# Patient Record
Sex: Male | Born: 1960 | Race: White | Hispanic: No | Smoking: Current every day smoker
Health system: Southern US, Community
[De-identification: ages and names within clinical notes are randomized; demographics above are authoritative.]

## PROBLEM LIST (undated history)

## (undated) DIAGNOSIS — I10 Essential (primary) hypertension: Secondary | ICD-10-CM

## (undated) DIAGNOSIS — K56609 Unspecified intestinal obstruction, unspecified as to partial versus complete obstruction: Secondary | ICD-10-CM

## (undated) DIAGNOSIS — J449 Chronic obstructive pulmonary disease, unspecified: Secondary | ICD-10-CM

---

## 2000-05-11 ENCOUNTER — Encounter: Payer: Self-pay | Admitting: Emergency Medicine

## 2000-05-11 ENCOUNTER — Emergency Department (HOSPITAL_COMMUNITY): Admission: EM | Admit: 2000-05-11 | Discharge: 2000-05-11 | Payer: Self-pay

## 2002-05-15 ENCOUNTER — Emergency Department (HOSPITAL_COMMUNITY): Admission: EM | Admit: 2002-05-15 | Discharge: 2002-05-16 | Payer: Self-pay | Admitting: Emergency Medicine

## 2002-05-15 ENCOUNTER — Encounter: Payer: Self-pay | Admitting: Emergency Medicine

## 2005-10-14 ENCOUNTER — Emergency Department (HOSPITAL_COMMUNITY): Admission: EM | Admit: 2005-10-14 | Discharge: 2005-10-14 | Payer: Self-pay | Admitting: Emergency Medicine

## 2005-11-10 ENCOUNTER — Emergency Department (HOSPITAL_COMMUNITY): Admission: EM | Admit: 2005-11-10 | Discharge: 2005-11-10 | Payer: Self-pay | Admitting: Emergency Medicine

## 2005-12-05 HISTORY — PX: ORIF MANDIBULAR FRACTURE: SHX2127

## 2005-12-15 ENCOUNTER — Inpatient Hospital Stay (HOSPITAL_COMMUNITY): Admission: EM | Admit: 2005-12-15 | Discharge: 2005-12-16 | Payer: Self-pay | Admitting: Emergency Medicine

## 2005-12-17 ENCOUNTER — Emergency Department (HOSPITAL_COMMUNITY): Admission: EM | Admit: 2005-12-17 | Discharge: 2005-12-17 | Payer: Self-pay | Admitting: Emergency Medicine

## 2008-09-14 ENCOUNTER — Emergency Department (HOSPITAL_COMMUNITY): Admission: EM | Admit: 2008-09-14 | Discharge: 2008-09-14 | Payer: Self-pay | Admitting: Emergency Medicine

## 2008-09-23 ENCOUNTER — Emergency Department (HOSPITAL_COMMUNITY): Admission: EM | Admit: 2008-09-23 | Discharge: 2008-09-23 | Payer: Self-pay | Admitting: Emergency Medicine

## 2008-10-01 ENCOUNTER — Emergency Department (HOSPITAL_COMMUNITY): Admission: EM | Admit: 2008-10-01 | Discharge: 2008-10-01 | Payer: Self-pay | Admitting: Emergency Medicine

## 2008-10-06 ENCOUNTER — Emergency Department (HOSPITAL_COMMUNITY): Admission: EM | Admit: 2008-10-06 | Discharge: 2008-10-06 | Payer: Self-pay | Admitting: Emergency Medicine

## 2008-11-19 ENCOUNTER — Emergency Department (HOSPITAL_COMMUNITY): Admission: EM | Admit: 2008-11-19 | Discharge: 2008-11-19 | Payer: Self-pay | Admitting: Emergency Medicine

## 2009-01-14 ENCOUNTER — Emergency Department (HOSPITAL_COMMUNITY): Admission: EM | Admit: 2009-01-14 | Discharge: 2009-01-14 | Payer: Self-pay | Admitting: Emergency Medicine

## 2009-01-29 ENCOUNTER — Emergency Department (HOSPITAL_COMMUNITY): Admission: EM | Admit: 2009-01-29 | Discharge: 2009-01-29 | Payer: Self-pay | Admitting: Emergency Medicine
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
## 2014-02-02 DIAGNOSIS — K56609 Unspecified intestinal obstruction, unspecified as to partial versus complete obstruction: Secondary | ICD-10-CM

## 2014-02-02 HISTORY — DX: Unspecified intestinal obstruction, unspecified as to partial versus complete obstruction: K56.609

## 2014-02-12 ENCOUNTER — Emergency Department (HOSPITAL_COMMUNITY)
Admission: EM | Admit: 2014-02-12 | Discharge: 2014-02-13 | Disposition: A | Discharge status: Home / Self Care | Payer: Self-pay | Attending: Emergency Medicine | Admitting: Emergency Medicine

## 2014-02-12 DIAGNOSIS — J441 Chronic obstructive pulmonary disease with (acute) exacerbation: Secondary | ICD-10-CM | POA: Insufficient documentation

## 2014-02-12 DIAGNOSIS — I10 Essential (primary) hypertension: Secondary | ICD-10-CM | POA: Insufficient documentation

## 2014-02-12 DIAGNOSIS — F17200 Nicotine dependence, unspecified, uncomplicated: Secondary | ICD-10-CM | POA: Insufficient documentation

## 2014-02-12 HISTORY — DX: Essential (primary) hypertension: I10

## 2014-02-13 ENCOUNTER — Emergency Department (HOSPITAL_COMMUNITY): Payer: Self-pay

## 2014-02-13 ENCOUNTER — Encounter (HOSPITAL_COMMUNITY): Payer: Self-pay | Admitting: Emergency Medicine

## 2014-02-13 MED ORDER — PREDNISONE 20 MG PO TABS: 60.0000 mg | ORAL_TABLET | Freq: Every day | ORAL | Status: DC

## 2014-02-13 MED ORDER — ALBUTEROL SULFATE HFA 108 (90 BASE) MCG/ACT IN AERS
2.0000 | INHALATION_SPRAY | RESPIRATORY_TRACT | Status: DC | PRN
  Filled 2014-02-13: qty 6.7

## 2014-02-13 MED ORDER — PREDNISONE 50 MG PO TABS
60.0000 mg | ORAL_TABLET | Freq: Once | ORAL | Status: AC
  Administered 2014-02-13: 60 mg via ORAL
  Filled 2014-02-13 (×2): qty 1

## 2014-02-13 MED ORDER — ALBUTEROL SULFATE (2.5 MG/3ML) 0.083% IN NEBU
5.0000 mg | INHALATION_SOLUTION | Freq: Once | RESPIRATORY_TRACT | Status: AC
  Administered 2014-02-13: 5 mg via RESPIRATORY_TRACT
  Filled 2014-02-13: qty 6

## 2014-02-13 NOTE — ED Notes (Signed)
Patient states he is homeless and was burning wood outside.  Patient states he is having a hard time breathing.

## 2014-02-13 NOTE — ED Provider Notes (Signed)
CSN: [id number]     Arrival date & time 02/12/14  2354 History  This chart was scribed for Sunnie NielsenBrian Shabnam Ladd, MD by Ardelia Memsylan Malpass, ED Scribe. This patient was seen in room APA10/APA10 and the patient's care was started at 12:02 AM.   Chief Complaint  Patient presents with  . Shortness of Breath    Patient is a 53 y.o. male presenting with shortness of breath. The history is provided by the patient. No language interpreter was used.  Shortness of Breath Severity:  Moderate Onset quality:  Gradual Duration:  1 day Timing:  Constant Progression:  Unchanged Chronicity:  New Context: smoke exposure and weather changes   Relieved by:  Nothing Worsened by:  Nothing tried Ineffective treatments:  Inhaler Associated symptoms: no chest pain and no fever   Risk factors: alcohol use and tobacco use     HPI Comments: [name] is a 53 y.o. male with a history of HTN who presents to the Emergency Department complaining of SOB onset earlier tonight. He states that he is homeless and he was burning wood earlier tonight which may have contributed. He also states that he believes that weather changes have contributed to his SOB. He states that he has been using an inhaler without relief. He denies chest pain, fever, chills or any other symptoms. He states that he is a current every day smoker.   Past Medical History  Diagnosis Date  . Hypertension    History reviewed. No pertinent past surgical history. No family history on file. History  Substance Use Topics  . Smoking status: Current Every Day Smoker  . Smokeless tobacco: Not on file  . Alcohol Use: Yes    Review of Systems  Constitutional: Negative for fever and chills.  Respiratory: Positive for shortness of breath.   Cardiovascular: Negative for chest pain.  All other systems reviewed and are negative.   Allergies  Review of patient's allergies indicates no known allergies.  Home Medications  No current outpatient prescriptions  on file.  Triage Vitals: BP 151/93  Pulse 73  Temp(Src) 97.5 F (36.4 C) (Oral)  Resp 20  Ht 5\' 7"  (1.702 m)  Wt 120 lb (54.432 kg)  BMI 18.79 kg/m2  SpO2 100%  Physical Exam  Nursing note and vitals reviewed. Constitutional: He is oriented to person, place, and time. He appears well-developed and well-nourished. No distress.  HENT:  Head: Normocephalic and atraumatic.  Eyes: EOM are normal.  Neck: Neck supple. No tracheal deviation present.  Cardiovascular: Normal rate.   Pulmonary/Chest: Effort normal. No respiratory distress.  Shallow bilateral breath sounds. No accessory muscle use.  Musculoskeletal: Normal range of motion.  Neurological: He is alert and oriented to person, place, and time.  Skin: Skin is warm and dry.  Psychiatric: He has a normal mood and affect. His behavior is normal.    ED Course  Procedures (including critical care time)  DIAGNOSTIC STUDIES: Oxygen Saturation is 100% on RA, normal by my interpretation.    COORDINATION OF CARE: 12:06 AM- Discussed plan to order a CXR, an Albuterol breathing treatment and Prednisone. Pt advised of plan for treatment and pt agrees.  Medications  predniSONE (DELTASONE) tablet 60 mg (not administered)  albuterol (PROVENTIL) (2.5 MG/3ML) 0.083% nebulizer solution 5 mg (5 mg Nebulization Given 02/13/14 0028)   Labs Review Labs Reviewed - No data to display Imaging Review Dg Chest 2 View  02/13/2014   CLINICAL DATA Shortness of breath.  EXAM CHEST  2 VIEW  COMPARISON Prior radiograph from 12/15/2005  FINDINGS The cardiac and mediastinal silhouettes are stable in size and contour, and remain within normal limits.  The lungs are hyperinflated with attenuation of the pulmonary markings, greatest within the left lung base, similar to prior. Findings are compatible with COPD. No airspace consolidation, pleural effusion, or pulmonary edema is identified. There is no pneumothorax. Irregular biapical pleural thickening noted.   No acute osseous abnormality identified.  IMPRESSION COPD.  No acute cardiopulmonary abnormality.  SIGNATURE  Electronically Signed   By: Rise Mu M.D.   On: 02/13/2014 01:51     EKG Interpretation   Date/Time:  Thursday February 13 2014 00:09:50 EDT Ventricular Rate:  72 PR Interval:  130 QRS Duration: 100 QT Interval:  384 QTC Calculation: 420 R Axis:   85 Text Interpretation:  Normal sinus rhythm Incomplete right bundle branch  block Borderline ECG When compared with ECG of 15-Dec-2005 23:35, No  significant change was found Confirmed by Kroy Sprung  MD, Lailah Marcelli ([id number]) on  02/13/2014 12:28:19 AM      Albuterol breathing treatment and prednisone provided.  On recheck is feeling significantly better. Patient ambulates to radiology without dyspnea or distress. Chest x-ray reviewed as above. Lungs clear to auscultation.  Plan discharge home with albuterol inhaler. Patient encouraged to fill prescription for prednisone for the next 5 days. Return precautions and outpatient referral provided. Symptoms improved and stable for discharge.  MDM   Diagnosis: COPD exacerbation  Serial evaluations// Symptoms improved with albuterol and prednisone  Chest x-ray obtained and reviewed as above consistent with COPD, no infiltrate  Vital signs and nurses notes reviewed and considered  I personally performed the services described in this documentation, which was scribed in my presence. The recorded information has been reviewed and is accurate.    Sunnie Nielsen, MD 02/13/14 [phone]

## 2014-02-13 NOTE — Discharge Instructions (Signed)
Chronic Obstructive Pulmonary Disease Chronic obstructive pulmonary disease (COPD) is a common lung problem. In COPD, the flow of air from the lungs is limited. The way your lungs work will probably never return to normal, but there are things you can do to improve you lungs and make yourself feel better. HOME CARE  Take all medicines as told by your doctor.  Only take over-the-counter or prescription medicines as told by your doctor.  Avoid medicines or cough syrups that dry up your airway (such as antihistamines) and do not allow you to get rid of thick spit. You do not need to avoid them if told differently by your doctor.  If you smoke, stop. Smoking makes the problem worse.  Avoid being around things that make your breathing worse (like smoke, chemicals, and fumes).  Use oxygen therapy and therapy to help improve your lungs (pulmonary rehabilitation) if told by your doctor. If you need home oxygen therapy, ask your doctor if you should buy a tool to measure your oxygen level (oximeter).  Avoid people who have a sickness you can catch (contagious).  Avoid going outside when it is very hot, cold, or humid.  Eat healthy foods. Eat smaller meals more often. Rest before meals.  Stay active, but remember to also rest.  Make sure to get all the shots (vaccines) your doctor recommends. Ask your doctor if you need a pneumonia shot.  Learn and use tips on how to relax.  Learn and use tips on how to control your breathing as told by your doctor. Try:  Breathing in (inhaling) through your nose for 1 second. Then, pucker your lips and breath out (exhale) through your lips for 2 seconds.  Putting one hand on your belly (abdomen). Breathe in slowly through your nose for 1 second. Your hand on your belly should move out. Pucker your lips and breathe out slowly through your lips. Your hand on your belly should move in as you breathe out.  Learn and use controlled coughing to clear thick spit  from your lungs. 1. Lean your head a little forward. 2. Breathe in deeply. 3. Try to hold your breath for 3 seconds. 4. Keep your mouth slightly open while coughing 2 times. 5. Spit any thick spit out into a tissue. 6. Rest and do the steps again 1 or 2 times as needed. GET HELP IF:  You cough up more thick spit than usual.  There is a change in the color or thickness of the spit.  It is harder to breathe than usual.  Your breathing is faster than usual. GET HELP RIGHT AWAY IF:   You have shortness of breath while resting.  You have shortness of breath that stops you from:  Being able to talk.  Doing normal activities.  You chest hurts for longer than 5 minutes.  Your skin color is more blue than usual.  Your pulse oximeter shows that you have low oxygen for longer than 5 minutes. MAKE SURE YOU:   Understand these instructions.  Will watch your condition.  Will get help right away if you are not doing well or get worse. Document Released: 05/09/2008 Document Revised: 09/11/2013 Document Reviewed: 07/18/2013 [employer] Patient Information 2014 [employer].  
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
## 2014-02-19 ENCOUNTER — Emergency Department (HOSPITAL_COMMUNITY): Payer: Self-pay

## 2014-02-19 ENCOUNTER — Inpatient Hospital Stay (HOSPITAL_COMMUNITY)
Admission: EM | Admit: 2014-02-19 | Discharge: 2014-02-24 | DRG: 389 | Disposition: A | Discharge status: Home / Self Care | Payer: Self-pay | Attending: General Surgery | Admitting: General Surgery

## 2014-02-19 ENCOUNTER — Encounter (HOSPITAL_COMMUNITY): Payer: Self-pay | Admitting: Emergency Medicine

## 2014-02-19 DIAGNOSIS — Z5900 Homelessness unspecified: Secondary | ICD-10-CM

## 2014-02-19 DIAGNOSIS — I10 Essential (primary) hypertension: Secondary | ICD-10-CM | POA: Diagnosis present

## 2014-02-19 DIAGNOSIS — E440 Moderate protein-calorie malnutrition: Secondary | ICD-10-CM | POA: Insufficient documentation

## 2014-02-19 DIAGNOSIS — F17200 Nicotine dependence, unspecified, uncomplicated: Secondary | ICD-10-CM | POA: Diagnosis present

## 2014-02-19 DIAGNOSIS — IMO0002 Reserved for concepts with insufficient information to code with codable children: Secondary | ICD-10-CM

## 2014-02-19 DIAGNOSIS — K56609 Unspecified intestinal obstruction, unspecified as to partial versus complete obstruction: Principal | ICD-10-CM | POA: Diagnosis present

## 2014-02-19 LAB — COMPREHENSIVE METABOLIC PANEL
ALK PHOS: 76 U/L (ref 39–117)
ALT: 20 U/L (ref 0–53)
AST: 24 U/L (ref 0–37)
Albumin: 3.7 g/dL (ref 3.5–5.2)
BUN: 18 mg/dL (ref 6–23)
CO2: 28 mEq/L (ref 19–32)
Calcium: 8.7 mg/dL (ref 8.4–10.5)
Chloride: 100 mEq/L (ref 96–112)
Creatinine, Ser: 0.94 mg/dL (ref 0.50–1.35)
GFR calc Af Amer: >90 mL/min (ref >90)
GFR calc non Af Amer: >90 mL/min (ref >90)
Glucose, Bld: 147 mg/dL — ABNORMAL HIGH (ref 70–99)
POTASSIUM: 4.5 meq/L (ref 3.7–5.3)
Sodium: 139 mEq/L (ref 137–147)
Total Bilirubin: 0.4 mg/dL (ref 0.3–1.2)
Total Protein: 6.5 g/dL (ref 6.0–8.3)

## 2014-02-19 LAB — CBC WITH DIFFERENTIAL/PLATELET
BASOS PCT: 0 % (ref 0–1)
Basophils Absolute: 0 10*3/uL (ref 0.0–0.1)
Eosinophils Absolute: 0 10*3/uL (ref 0.0–0.7)
Eosinophils Relative: 0 % (ref 0–5)
HCT: 42.2 % (ref 39.0–52.0)
Hemoglobin: 14.5 g/dL (ref 13.0–17.0)
Lymphocytes Relative: 5 % — ABNORMAL LOW (ref 12–46)
Lymphs Abs: 0.7 10*3/uL (ref 0.7–4.0)
MCH: 33.2 pg (ref 26.0–34.0)
MCHC: 34.4 g/dL (ref 30.0–36.0)
MCV: 96.6 fL (ref 78.0–100.0)
Monocytes Absolute: 0.7 10*3/uL (ref 0.1–1.0)
Monocytes Relative: 5 % (ref 3–12)
Neutro Abs: 12.8 10*3/uL — ABNORMAL HIGH (ref 1.7–7.7)
Neutrophils Relative %: 90 % — ABNORMAL HIGH (ref 43–77)
Platelets: 256 10*3/uL (ref 150–400)
RBC: 4.37 MIL/uL (ref 4.22–5.81)
RDW: 14 % (ref 11.5–15.5)
WBC: 14.2 10*3/uL — ABNORMAL HIGH (ref 4.0–10.5)

## 2014-02-19 LAB — LIPASE, BLOOD: Lipase: 9 U/L — ABNORMAL LOW (ref 11–59)

## 2014-02-19 MED ORDER — CIPROFLOXACIN IN D5W 400 MG/200ML IV SOLN
400.0000 mg | Freq: Two times a day (BID) | INTRAVENOUS | Status: DC
  Administered 2014-02-20 – 2014-02-24 (×9): 400 mg via INTRAVENOUS
  Filled 2014-02-19 (×9): qty 200

## 2014-02-19 MED ORDER — LORAZEPAM 1 MG PO TABS: 1.0000 mg | ORAL_TABLET | Freq: Four times a day (QID) | ORAL | Status: AC | PRN

## 2014-02-19 MED ORDER — THIAMINE HCL 100 MG/ML IJ SOLN
100.0000 mg | Freq: Every day | INTRAMUSCULAR | Status: DC
  Administered 2014-02-19 – 2014-02-21 (×3): 100 mg via INTRAVENOUS
  Filled 2014-02-19 (×4): qty 2

## 2014-02-19 MED ORDER — NICOTINE 21 MG/24HR TD PT24
21.0000 mg | MEDICATED_PATCH | Freq: Every day | TRANSDERMAL | Status: DC
  Administered 2014-02-19 – 2014-02-24 (×6): 21 mg via TRANSDERMAL
  Filled 2014-02-19 (×6): qty 1

## 2014-02-19 MED ORDER — FOLIC ACID 1 MG PO TABS
1.0000 mg | ORAL_TABLET | Freq: Every day | ORAL | Status: DC
  Administered 2014-02-21 – 2014-02-24 (×4): 1 mg via ORAL
  Filled 2014-02-19 (×4): qty 1

## 2014-02-19 MED ORDER — LORAZEPAM 2 MG/ML IJ SOLN: 0.0000 mg | Freq: Four times a day (QID) | INTRAMUSCULAR | Status: AC

## 2014-02-19 MED ORDER — LORAZEPAM 2 MG/ML IJ SOLN: 1.0000 mg | Freq: Four times a day (QID) | INTRAMUSCULAR | Status: AC | PRN

## 2014-02-19 MED ORDER — IOHEXOL 300 MG/ML  SOLN
100.0000 mL | Freq: Once | INTRAMUSCULAR | Status: AC | PRN
  Administered 2014-02-19: 100 mL via INTRAVENOUS

## 2014-02-19 MED ORDER — VITAMIN B-1 100 MG PO TABS
100.0000 mg | ORAL_TABLET | Freq: Every day | ORAL | Status: DC
  Administered 2014-02-22 – 2014-02-24 (×3): 100 mg via ORAL
  Filled 2014-02-19 (×3): qty 1

## 2014-02-19 MED ORDER — ADULT MULTIVITAMIN W/MINERALS CH
1.0000 | ORAL_TABLET | Freq: Every day | ORAL | Status: DC
  Administered 2014-02-21 – 2014-02-24 (×4): 1 via ORAL
  Filled 2014-02-19 (×4): qty 1

## 2014-02-19 MED ORDER — SODIUM CHLORIDE 0.9 % IV BOLUS (SEPSIS)
1000.0000 mL | Freq: Once | INTRAVENOUS | Status: AC
  Administered 2014-02-19: 1000 mL via INTRAVENOUS

## 2014-02-19 MED ORDER — IOHEXOL 300 MG/ML  SOLN
50.0000 mL | Freq: Once | INTRAMUSCULAR | Status: AC | PRN
  Administered 2014-02-19: 50 mL via ORAL

## 2014-02-19 MED ORDER — ONDANSETRON HCL 4 MG/2ML IJ SOLN
4.0000 mg | Freq: Once | INTRAMUSCULAR | Status: AC
  Administered 2014-02-19: 4 mg via INTRAVENOUS
  Filled 2014-02-19: qty 2

## 2014-02-19 MED ORDER — MORPHINE SULFATE 4 MG/ML IJ SOLN
4.0000 mg | Freq: Once | INTRAMUSCULAR | Status: AC
  Administered 2014-02-19: 4 mg via INTRAVENOUS
  Filled 2014-02-19: qty 1

## 2014-02-19 MED ORDER — METRONIDAZOLE IN NACL 5-0.79 MG/ML-% IV SOLN
500.0000 mg | Freq: Three times a day (TID) | INTRAVENOUS | Status: DC
  Administered 2014-02-19 – 2014-02-24 (×16): 500 mg via INTRAVENOUS
  Filled 2014-02-19 (×16): qty 100

## 2014-02-19 MED ORDER — ACETAMINOPHEN 325 MG PO TABS: 650.0000 mg | ORAL_TABLET | Freq: Four times a day (QID) | ORAL | Status: DC | PRN

## 2014-02-19 MED ORDER — ACETAMINOPHEN 650 MG RE SUPP: 650.0000 mg | Freq: Four times a day (QID) | RECTAL | Status: DC | PRN

## 2014-02-19 MED ORDER — DEXTROSE IN LACTATED RINGERS 5 % IV SOLN
INTRAVENOUS | Status: DC
  Administered 2014-02-19: 12:00:00 via INTRAVENOUS

## 2014-02-19 MED ORDER — ONDANSETRON HCL 4 MG/2ML IJ SOLN: 4.0000 mg | Freq: Four times a day (QID) | INTRAMUSCULAR | Status: DC | PRN

## 2014-02-19 MED ORDER — ENOXAPARIN SODIUM 40 MG/0.4ML ~~LOC~~ SOLN
40.0000 mg | SUBCUTANEOUS | Status: DC
  Administered 2014-02-19 – 2014-02-24 (×6): 40 mg via SUBCUTANEOUS
  Filled 2014-02-19 (×6): qty 0.4

## 2014-02-19 MED ORDER — CIPROFLOXACIN IN D5W 400 MG/200ML IV SOLN
400.0000 mg | Freq: Two times a day (BID) | INTRAVENOUS | Status: DC
Start: 2014-02-19 — End: 2014-02-19
  Administered 2014-02-19: 400 mg via INTRAVENOUS
  Filled 2014-02-19: qty 200

## 2014-02-19 MED ORDER — HYDROMORPHONE HCL PF 1 MG/ML IJ SOLN
1.0000 mg | INTRAMUSCULAR | Status: DC | PRN
  Administered 2014-02-19 – 2014-02-21 (×10): 1 mg via INTRAVENOUS
  Filled 2014-02-19 (×10): qty 1

## 2014-02-19 MED ORDER — LORAZEPAM 2 MG/ML IJ SOLN
0.0000 mg | Freq: Two times a day (BID) | INTRAMUSCULAR | Status: AC
  Filled 2014-02-19: qty 1

## 2014-02-19 MED ORDER — LIDOCAINE HCL 2 % EX GEL
CUTANEOUS | Status: AC
  Filled 2014-02-19: qty 10

## 2014-02-19 MED ORDER — PANTOPRAZOLE SODIUM 40 MG IV SOLR
40.0000 mg | Freq: Every day | INTRAVENOUS | Status: DC
  Administered 2014-02-19 – 2014-02-20 (×2): 40 mg via INTRAVENOUS
  Filled 2014-02-19 (×2): qty 40

## 2014-02-19 NOTE — Clinical Social Work Psychosocial (Signed)
Clinical Social Work Department BRIEF PSYCHOSOCIAL ASSESSMENT 02/19/2014  Patient:  [name], [name]     Account Number:  [id number]     Admit date:  02/19/2014  Clinical Social Worker:  Edwyna Shell, CLINICAL SOCIAL WORKER  Date/Time:  02/19/2014 11:00 AM  Referred by:  Physician  Date Referred:  02/19/2014 Referred for  Homelessness   Other Referral:   Interview type:  Patient Other interview type:    PSYCHOSOCIAL DATA Living Status:  ALONE Admitted from facility:   Level of care:   Primary support name:  None identified Primary support relationship to patient:   Degree of support available:   Patient has little/no contact w family or friends in area. Has sister in [place] Current Concerns  Post-Acute Placement   Other Concerns:    SOCIAL WORK ASSESSMENT / PLAN CSW met w patient at bedside, patient alert and oriented x4.  Patient says he lives in tent "somewhere in [place]" - he did not want to give location "because I like to keep to myself."  Says he has been in [place] since 10/04/13 this time, but "i have lived here off/on for past 20 years."  Has ex-wife in [place], daughter in [place] (may be in college) and son in [place].  Has little contact w any of them.  Was living in [place] w sister, sister moved back to [place], patient came to [place] w friends, one of whom has since died and the other has "moved on."  Patient last had stable housing/name on lease approx 9 years ago in [place] on [address] near [place].    Patient has supported himself through doing a variety of odd jobs since return to [employer].  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 Has had difficult time finding job here "because of my age."    Has lived in tent throughout winter, says "I am making it" but says that  he would like to find stable housing and stable job at some point.  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  Eats meals at soup kitchen and [employer].    Agreeable to referral to [facility]ational Nurse, agrees that he may need help w arranging medical care.  Has been to ED twice, including one visit for breathing difficulties due to sitting around his wood fire.  CSW attempted to contact [facility] using contact info given, was unable to reach. Will research and refer if possible.  Did give patient Help for Homelessness, patient states he will call .  Needs assistance w job search and housing stability, which this organization may be able to assist w.   Assessment/plan status:  Psychosocial Support/Ongoing Assessment of Needs Other assessment/ plan:   Information/referral to community resources:   [employer]    PATIENT'S/FAMILY'S RESPONSE TO PLAN OF CARE: Patient very appreciative of help and referrals, does not have phone or mailing address, so has not been able to access resources.  CSw encouraged him to use time in hospital to connect w resources if possible        Edwyna Shell, [place] Worker [phone])

## 2014-02-19 NOTE — Progress Notes (Signed)
INITIAL NUTRITION ASSESSMENT  DOCUMENTATION CODES Per approved criteria  -Non-severe (moderate) malnutrition in the context of social or environmental circumstances   Pt meets criteria for moderate MALNUTRITION in the context of social and environmental cicrumstances as evidenced by moderate muscle and fat depletion, <75% estimated energy intake x 3 months, 7.7% wt loss x 3 months.  INTERVENTION: Follow for diet advancement  NUTRITION DIAGNOSIS: Inadequate oral intake related to altered GI function as evidenced by NPO.   Goal: Pt will meet >90% of estimated nutritional needs  Monitor:  Diet advancement, PO intake, labs, weight changes, skin assessments, I/O's  Reason for Assessment: MST=2  53 y.o. male  Admitting Dx: <principal problem not specified>  ASSESSMENT: Pt admitted for vomiting and severe cramping. Per surgeon's notes, pt with enteritis vs.SBO with possible ileus. No surgical interventions at this time. Pt with ng tube for decompression.  [place]. He states fair appetite PTA, reporting he "eats when he can". Home diet consists of fast food and soup kitchen foods.  He reports UBW of 130#, which he has maintained most of his adult life. He reports a 10# (7.7%) wt loss x 3 months, which is clinically significant.  He is agreeable to trying a nutritional supplement once diet is advanced.   Nutrition Focused Physical Exam:  Subcutaneous Fat:  Orbital Region: WDL Upper Arm Region: moderate depletion Thoracic and Lumbar Region: moderate depletion  Muscle:  Temple Region: WDL Clavicle Bone Region: moderate depletion Clavicle and Acromion Bone Region: moderate depletion Scapular Bone Region: moderate depletion Dorsal Hand: moderate depletion Patellar Region: moderate depletion Anterior Thigh Region: moderate depletion Posterior Calf Region: moderate depletion  Edema: none present   Height: Ht Readings from Last 1 Encounters:   02/19/14 5\' 6"  (1.676 m)    Weight: Wt Readings from Last 1 Encounters:  02/19/14 120 lb (54.432 kg)    Ideal Body Weight: 142#  % Ideal Body Weight: 83%  Wt Readings from Last 10 Encounters:  02/19/14 120 lb (54.432 kg)  02/13/14 120 lb (54.432 kg)    Usual Body Weight: 130#  % Usual Body Weight: 92%  BMI:  Body mass index is 19.38 kg/(m^2). Meets criteria for normal weight.   Estimated Nutritional Needs: Kcal: 1400-1600 daily Protein: 54-68 grams daily Fluid: 1.4-1.6 L daily  Skin: No issues noted   Diet Order: NPO  EDUCATION NEEDS: -Education needs addressed   Intake/Output Summary (Last 24 hours) at 02/19/14 1320 Last data filed at 02/19/14 0800  Gross per 24 hour  Intake      0 ml  Output    300 ml  Net   -300 ml    Last BM: PTA   Labs:   Recent Labs Lab 02/19/14 0320  NA 139  K 4.5  CL 100  CO2 28  BUN 18  CREATININE 0.94  CALCIUM 8.7  GLUCOSE 147*    CBG (last 3)  No results found for this basename: GLUCAP,  in the last 72 hours  Scheduled Meds: . ciprofloxacin  400 mg Intravenous Q12H  . enoxaparin (LOVENOX) injection  40 mg Subcutaneous Q24H  . folic acid  1 mg Oral Daily  . lidocaine      . LORazepam  0-4 mg Intravenous Q6H   Followed by  . [START ON 02/21/2014] LORazepam  0-4 mg Intravenous Q12H  . metronidazole  500 mg Intravenous Q8H  . multivitamin with minerals  1 tablet Oral Daily  . nicotine  21 mg  Transdermal Daily  . pantoprazole (PROTONIX) IV  40 mg Intravenous QHS  . thiamine  100 mg Oral Daily   Or  . thiamine  100 mg Intravenous Daily    Continuous Infusions: . dextrose 5% lactated ringers 125 mL/hr at 02/19/14 1137    Past Medical History  Diagnosis Date  . Hypertension     History reviewed. No pertinent past surgical history.  Ramsie Ostrander A. Mayford KnifeWilliams, RD, LDN Pager: [phone]

## 2014-02-19 NOTE — Progress Notes (Signed)
UR chart review completed.  

## 2014-02-19 NOTE — ED Provider Notes (Signed)
CSN: [id number]     Arrival date & time 02/19/14  0155 History   First MD Initiated Contact with Patient 02/19/14 0239     No chief complaint on file.    (Consider location/radiation/quality/duration/timing/severity/associated sxs/prior Treatment) HPI Comments: Patient presents with complaints of vomiting and abd pain that started at 4 AM.  Pain is mostly epigastric.  He is currently homeless and [place] the woods.    Patient is a 53 y.o. male presenting with abdominal pain. The history is provided by the patient.  Abdominal Pain Pain location:  Generalized Pain quality: cramping   Pain radiates to:  Does not radiate Pain severity:  Moderate Onset quality:  Sudden Duration:  12 hours Timing:  Constant Progression:  Worsening Chronicity:  New Context: not alcohol use and not eating   Relieved by:  Nothing Worsened by:  Nothing tried Ineffective treatments:  None tried Associated symptoms: vomiting     Past Medical History  Diagnosis Date  . Hypertension    History reviewed. No pertinent past surgical history. No family history on file. History  Substance Use Topics  . Smoking status: Current Every Day Smoker  . Smokeless tobacco: Not on file  . Alcohol Use: Yes    Review of Systems  Gastrointestinal: Positive for vomiting and abdominal pain.  All other systems reviewed and are negative.      Allergies  Review of patient's allergies indicates no known allergies.  Home Medications   Current Outpatient Rx  Name  Route  Sig  Dispense  Refill  . predniSONE (DELTASONE) 20 MG tablet   Oral   Take 3 tablets (60 mg total) by mouth daily.   15 tablet   0    BP 145/107  Pulse 89  Temp(Src) 97.7 F (36.5 C) (Oral)  Resp 20  Ht 5\' 6"  (1.676 m)  Wt 120 lb (54.432 kg)  BMI 19.38 kg/m2  SpO2 100% Physical Exam  Nursing note and vitals reviewed. Constitutional: He is oriented to person, place, and time. He appears well-developed and well-nourished. No  distress.  HENT:  Head: Normocephalic and atraumatic.  Mouth/Throat: Oropharynx is clear and moist.  Neck: Normal range of motion. Neck supple.  Cardiovascular: Normal rate, regular rhythm and normal heart sounds.   No murmur heard. Pulmonary/Chest: Effort normal and breath sounds normal. No respiratory distress. He has no wheezes.  Abdominal: Soft. Bowel sounds are normal. He exhibits no distension and no mass. There is tenderness. There is no rebound and no guarding.  TTP in the epigastric region.  Musculoskeletal: Normal range of motion. He exhibits no edema.  Neurological: He is alert and oriented to person, place, and time.  Skin: Skin is warm and dry. He is not diaphoretic.    ED Course  Procedures (including critical care time) Labs Review Labs Reviewed  CBC WITH DIFFERENTIAL  COMPREHENSIVE METABOLIC PANEL  LIPASE, BLOOD   Imaging Review No results found.   EKG Interpretation None      MDM   Final diagnoses:  None    Patient presents with vomiting and severe abdominal cramping. Workup reveals a white count of 14,000 and CT scan reveals dilated bowel loops consistent with a small bowel obstruction. His given pain medicine and IV fluids. I spoke with Dr. Lovell SheehanJenkins from general surgery who will evaluate the patient in the ER. He is recommending and ng tube which will be placed.    Geoffery Lyonsouglas Aziza Stuckert, MD 02/19/14 0630

## 2014-02-19 NOTE — ED Notes (Signed)
Pt states he started having mid abd pain Tuesday am, states now with vomiting, no diarrhea.

## 2014-02-19 NOTE — H&P (Signed)
[name] is an 53 y.o. male.   Chief Complaint: Nausea and abdominal pain HPI: Patient is a 53 year old white male who is homeless and lives in a tent in [name]. He presented with a 24-hour history of worsening nausea and vomiting. He states is abdomen started hurting. He last had a bowel movement 2 days ago. Last drink alcohol 5 days ago. His abdominal pain is non-specific in nature. He has never had an episode like this before. He has never had surgery on his abdomen before. He denies any fever or chills.  Past Medical History  Diagnosis Date  . Hypertension     History reviewed. No pertinent past surgical history.  No family history on file. Social History:  reports that he has been smoking.  He does not have any smokeless tobacco history on file. He reports that he drinks alcohol. He reports that he does not use illicit drugs.  Allergies: No Known Allergies   (Not in a hospital admission)  Results for orders placed during the hospital encounter of 02/19/14 (from the past 48 hour(s))  CBC WITH DIFFERENTIAL     Status: Abnormal   Collection Time    02/19/14  3:20 AM      Result Value Ref Range   WBC 14.2 (*) 4.0 - 10.5 K/uL   RBC 4.37  4.22 - 5.81 MIL/uL   Hemoglobin 14.5  13.0 - 17.0 g/dL   HCT 42.2  39.0 - 52.0 %   MCV 96.6  78.0 - 100.0 fL   MCH 33.2  26.0 - 34.0 pg   MCHC 34.4  30.0 - 36.0 g/dL   RDW 14.0  11.5 - 15.5 %   Platelets 256  150 - 400 K/uL   Neutrophils Relative % 90 (*) 43 - 77 %   Neutro Abs 12.8 (*) 1.7 - 7.7 K/uL   Lymphocytes Relative 5 (*) 12 - 46 %   Lymphs Abs 0.7  0.7 - 4.0 K/uL   Monocytes Relative 5  3 - 12 %   Monocytes Absolute 0.7  0.1 - 1.0 K/uL   Eosinophils Relative 0  0 - 5 %   Eosinophils Absolute 0.0  0.0 - 0.7 K/uL   Basophils Relative 0  0 - 1 %   Basophils Absolute 0.0  0.0 - 0.1 K/uL  COMPREHENSIVE METABOLIC PANEL     Status: Abnormal   Collection Time    02/19/14  3:20 AM      Result Value Ref Range   Sodium 139   137 - 147 mEq/L   Potassium 4.5  3.7 - 5.3 mEq/L   Chloride 100  96 - 112 mEq/L   CO2 28  19 - 32 mEq/L   Glucose, Bld 147 (*) 70 - 99 mg/dL   BUN 18  6 - 23 mg/dL   Creatinine, Ser 0.94  0.50 - 1.35 mg/dL   Calcium 8.7  8.4 - 10.5 mg/dL   Total Protein 6.5  6.0 - 8.3 g/dL   Albumin 3.7  3.5 - 5.2 g/dL   AST 24  0 - 37 U/L   ALT 20  0 - 53 U/L   Alkaline Phosphatase 76  39 - 117 U/L   Total Bilirubin 0.4  0.3 - 1.2 mg/dL   GFR calc non Af Amer >90  >90 mL/min   GFR calc Af Amer >90  >90 mL/min   Comment: (NOTE)     The eGFR has been calculated using the CKD EPI equation.  This calculation has not been validated in all clinical situations.     eGFR's persistently <90 mL/min signify possible Chronic Kidney     Disease.  LIPASE, BLOOD     Status: Abnormal   Collection Time    02/19/14  3:20 AM      Result Value Ref Range   Lipase 9 (*) 11 - 59 U/L   Ct Abdomen Pelvis W Contrast  02/19/2014   CLINICAL DATA:  Abdominal pain and vomiting.  EXAM: CT ABDOMEN AND PELVIS WITH CONTRAST  TECHNIQUE: Multidetector CT imaging of the abdomen and pelvis was performed using the standard protocol following bolus administration of intravenous contrast.  CONTRAST:  50 mL OMNIPAQUE IOHEXOL 300 MG/ML SOLN, 100 mL OMNIPAQUE IOHEXOL 300 MG/ML SOLN  COMPARISON:  None.  FINDINGS: The lung bases are clear. No pleural or pericardial effusion. Heart size is normal.  There is a small volume of abdominal and pelvic ascites. No focal fluid collection is identified. There is mild and diffuse dilatation of small bowel with loops measuring up to 2.9 cm identified. Only the terminal ileum is spared No transition point is seen. There is no bowel wall thickening, pneumatosis, portal venous gas or free intraperitoneal air. The colon is largely decompressed but otherwise unremarkable. The appendix is visualized and appears normal.  A punctate hypo attenuating lesion in the liver on image 23 is likely a cyst. The liver is  otherwise unremarkable. The spleen, adrenal glands, pancreas and kidneys appear normal. There is no lymphadenopathy. No focal bony abnormality is identified.  IMPRESSION: Small volume of abdominal ascites and diffuse small bowel dilatation. The terminal ileum is decompressed and this may represent a transition point for small bowel obstruction. Small bowel ileus is also a consideration. No inflammatory process is identified. There is no evidence of bowel ischemia.   Electronically Signed   By: Inge Rise M.D.   On: 02/19/2014 05:59    Review of Systems  Constitutional: Positive for malaise/fatigue.  HENT: Negative.   Respiratory: Positive for cough.   Cardiovascular: Negative.   Gastrointestinal: Positive for nausea, vomiting and abdominal pain.  Genitourinary: Negative.   Musculoskeletal: Negative.   Skin: Negative.   All other systems reviewed and are negative.    Blood pressure 130/87, pulse 75, temperature 98 F (36.7 C), temperature source Oral, resp. rate 18, height 5' 6"  (1.676 m), weight 54.432 kg (120 lb), SpO2 97.00%. Physical Exam  Constitutional: He is oriented to person, place, and time. He appears well-developed and well-nourished.  HENT:  Head: Normocephalic and atraumatic.  Neck: Normal range of motion. Neck supple.  Cardiovascular: Normal rate, regular rhythm and normal heart sounds.   Respiratory: Effort normal and breath sounds normal.  GI: Soft. He exhibits distension. There is tenderness. There is no rebound.  Minimal bowel sounds appreciated.  Neurological: He is alert and oriented to person, place, and time.  Skin: Skin is warm and dry.     Assessment/Plan Impression: Enteritis versus partial small bowel obstruction. As he has not had abdominal surgery, I would favor an ileus. Plan: Will admit to the hospital for NG tube decompression we'll start ciprofloxacin and Flagyl for enteritis. No need for acute surgical intervention at this time.  [name]  A 02/19/2014, 8:29 AM

## 2014-02-20 DIAGNOSIS — E440 Moderate protein-calorie malnutrition: Secondary | ICD-10-CM | POA: Insufficient documentation

## 2014-02-20 LAB — CBC
HCT: 37.9 % — ABNORMAL LOW (ref 39.0–52.0)
Hemoglobin: 13 g/dL (ref 13.0–17.0)
MCH: 33.2 pg (ref 26.0–34.0)
MCHC: 34.3 g/dL (ref 30.0–36.0)
MCV: 96.7 fL (ref 78.0–100.0)
PLATELETS: 231 10*3/uL (ref 150–400)
RBC: 3.92 MIL/uL — AB (ref 4.22–5.81)
RDW: 14.1 % (ref 11.5–15.5)
WBC: 8.9 10*3/uL (ref 4.0–10.5)

## 2014-02-20 LAB — URINALYSIS, ROUTINE W REFLEX MICROSCOPIC
Bilirubin Urine: NEGATIVE
Glucose, UA: NEGATIVE mg/dL
Hgb urine dipstick: NEGATIVE
Ketones, ur: NEGATIVE mg/dL
Leukocytes, UA: NEGATIVE
Nitrite: NEGATIVE
Protein, ur: NEGATIVE mg/dL
Specific Gravity, Urine: 1.01 (ref 1.005–1.030)
Urobilinogen, UA: 0.2 mg/dL (ref 0.0–1.0)
pH: 7.5 (ref 5.0–8.0)

## 2014-02-20 LAB — BASIC METABOLIC PANEL
BUN: 12 mg/dL (ref 6–23)
CO2: 29 meq/L (ref 19–32)
Calcium: 8 mg/dL — ABNORMAL LOW (ref 8.4–10.5)
Chloride: 102 mEq/L (ref 96–112)
Creatinine, Ser: 0.89 mg/dL (ref 0.50–1.35)
GFR calc non Af Amer: >90 mL/min (ref >90)
Glucose, Bld: 105 mg/dL — ABNORMAL HIGH (ref 70–99)
Potassium: 3.8 mEq/L (ref 3.7–5.3)
Sodium: 138 mEq/L (ref 137–147)

## 2014-02-20 LAB — MAGNESIUM: Magnesium: 2 mg/dL (ref 1.5–2.5)

## 2014-02-20 LAB — PHOSPHORUS: Phosphorus: 3.1 mg/dL (ref 2.3–4.6)

## 2014-02-20 MED ORDER — MENTHOL 3 MG MT LOZG
1.0000 | LOZENGE | OROMUCOSAL | Status: DC | PRN
  Administered 2014-02-20: 3 mg via ORAL
  Filled 2014-02-20: qty 9

## 2014-02-20 MED ORDER — KCL IN DEXTROSE-NACL 20-5-0.45 MEQ/L-%-% IV SOLN
INTRAVENOUS | Status: DC
  Administered 2014-02-20: 09:00:00 via INTRAVENOUS
  Administered 2014-02-21: 50 mL/h via INTRAVENOUS
  Administered 2014-02-21 – 2014-02-23 (×3): via INTRAVENOUS

## 2014-02-20 NOTE — Clinical Social Work Note (Signed)
CSW met w patient and discussed various resources available to stabilize his living situation.  Referred to [facility] for [facility] [phone]), referral made to [facility] for homeless veterans [phone]).  [facility] has 24 business hours to respond and Mining engineer.  Patient needs to provide copy of DD214 (discharge papers) to enroll in [place] [id number] #0 to enroll). Patient says that he will ask his sister in  [place]  to fax a copy of DD 214.   [place] (Congregational Nurse) states that she can refer patient to [facility] for follow up PCP and will continue to follow patient at discharge.  [place] [facility] (Art Ashmore) called and took referral for patient, will assign to caseworker next week to see if they can assist w housing.  CSW contacted supervisor re [employer].  Not available in [facility] at present.  CSW discussed various options and referrals w patient.  Edwyna Shell, LCSW Clinical Social Worker [phone])

## 2014-02-20 NOTE — Progress Notes (Signed)
Subjective: No significant bowel movement yet. Patient states his abdominal pain is decreased.  Objective: Vital signs in last 24 hours: Temp:  [98.1 F (36.7 C)-98.5 F (36.9 C)] 98.1 F (36.7 C) (03/19 4098) Pulse Rate:  [62-73] 62 (03/19 0632) Resp:  [16-20] 20 (03/19 0632) BP: (102-127)/(66-86) 102/66 mmHg (03/19 0632) SpO2:  [97 %-98 %] 97 % (03/19 0632) Weight:  [53.8 kg (118 lb 9.7 oz)] 53.8 kg (118 lb 9.7 oz) (03/19 1191)    Intake/Output from previous day: 03/18 0701 - 03/19 0700 In: 2914.6 [I.V.:2414.6; IV Piggyback:500] Out: 200 [Urine:200] Intake/Output this shift: Total I/O In: -  Out: 300 [Urine:300]  General appearance: alert, cooperative and no distress Resp: clear to auscultation bilaterally Cardio: regular rate and rhythm, S1, S2 normal, no murmur, click, rub or gallop GI: Soft. Flat. Minimal tenderness noted to palpation. Occasional bowel sounds appreciated.  Lab Results:   Recent Labs  02/19/14 0320 02/20/14 0515  WBC 14.2* 8.9  HGB 14.5 13.0  HCT 42.2 37.9*  PLT 256 231   BMET  Recent Labs  02/19/14 0320 02/20/14 0515  NA 139 138  K 4.5 3.8  CL 100 102  CO2 28 29  GLUCOSE 147* 105*  BUN 18 12  CREATININE 0.94 0.89  CALCIUM 8.7 8.0*   PT/INR No results found for this basename: LABPROT, INR,  in the last 72 hours  Studies/Results: Ct Abdomen Pelvis W Contrast  02/19/2014   CLINICAL DATA:  Abdominal pain and vomiting.  EXAM: CT ABDOMEN AND PELVIS WITH CONTRAST  TECHNIQUE: Multidetector CT imaging of the abdomen and pelvis was performed using the standard protocol following bolus administration of intravenous contrast.  CONTRAST:  50 mL OMNIPAQUE IOHEXOL 300 MG/ML SOLN, 100 mL OMNIPAQUE IOHEXOL 300 MG/ML SOLN  COMPARISON:  None.  FINDINGS: The lung bases are clear. No pleural or pericardial effusion. Heart size is normal.  There is a small volume of abdominal and pelvic ascites. No focal fluid collection is identified. There is mild  and diffuse dilatation of small bowel with loops measuring up to 2.9 cm identified. Only the terminal ileum is spared No transition point is seen. There is no bowel wall thickening, pneumatosis, portal venous gas or free intraperitoneal air. The colon is largely decompressed but otherwise unremarkable. The appendix is visualized and appears normal.  A punctate hypo attenuating lesion in the liver on image 23 is likely a cyst. The liver is otherwise unremarkable. The spleen, adrenal glands, pancreas and kidneys appear normal. There is no lymphadenopathy. No focal bony abnormality is identified.  IMPRESSION: Small volume of abdominal ascites and diffuse small bowel dilatation. The terminal ileum is decompressed and this may represent a transition point for small bowel obstruction. Small bowel ileus is also a consideration. No inflammatory process is identified. There is no evidence of bowel ischemia.   Electronically Signed   By: Drusilla Kanner M.D.   On: 02/19/2014 05:59    Anti-infectives: Anti-infectives   Start     Dose/Rate Route Frequency Ordered Stop   02/20/14 0100  ciprofloxacin (CIPRO) IVPB 400 mg     400 mg 200 mL/hr over 60 Minutes Intravenous Every 12 hours 02/19/14 1522     02/19/14 1000  ciprofloxacin (CIPRO) IVPB 400 mg  Status:  Discontinued     400 mg 200 mL/hr over 60 Minutes Intravenous Every 12 hours 02/19/14 0836 02/19/14 1522   02/19/14 0900  metroNIDAZOLE (FLAGYL) IVPB 500 mg     500 mg 100 mL/hr over 60  Minutes Intravenous Every 8 hours 02/19/14 0836        Assessment/Plan: Impression: Small bowel obstruction versus ileus. Slightly improved. White blood cell count has normalized. Plan: Continue NG tube decompression. Continue to monitor abdominal examination.   LOS: 1 day    Deiona Hooper A 02/20/2014

## 2014-02-21 LAB — CBC
HCT: 38.7 % — ABNORMAL LOW (ref 39.0–52.0)
Hemoglobin: 13.1 g/dL (ref 13.0–17.0)
MCH: 33 pg (ref 26.0–34.0)
MCHC: 33.9 g/dL (ref 30.0–36.0)
MCV: 97.5 fL (ref 78.0–100.0)
PLATELETS: 236 10*3/uL (ref 150–400)
RBC: 3.97 MIL/uL — ABNORMAL LOW (ref 4.22–5.81)
RDW: 13.9 % (ref 11.5–15.5)
WBC: 6.9 10*3/uL (ref 4.0–10.5)

## 2014-02-21 LAB — BASIC METABOLIC PANEL
BUN: 8 mg/dL (ref 6–23)
CALCIUM: 8.5 mg/dL (ref 8.4–10.5)
CO2: 27 mEq/L (ref 19–32)
CREATININE: 0.77 mg/dL (ref 0.50–1.35)
Chloride: 104 mEq/L (ref 96–112)
GFR calc Af Amer: >90 mL/min (ref >90)
GFR calc non Af Amer: >90 mL/min (ref >90)
Glucose, Bld: 94 mg/dL (ref 70–99)
Potassium: 4 mEq/L (ref 3.7–5.3)
Sodium: 139 mEq/L (ref 137–147)

## 2014-02-21 MED ORDER — PANTOPRAZOLE SODIUM 40 MG PO TBEC
40.0000 mg | DELAYED_RELEASE_TABLET | Freq: Every day | ORAL | Status: DC
  Administered 2014-02-21 – 2014-02-24 (×4): 40 mg via ORAL
  Filled 2014-02-21 (×4): qty 1

## 2014-02-21 MED ORDER — BOOST / RESOURCE BREEZE PO LIQD
1.0000 | Freq: Three times a day (TID) | ORAL | Status: DC
  Administered 2014-02-21 – 2014-02-24 (×7): 1 via ORAL

## 2014-02-21 NOTE — Progress Notes (Signed)
  Subjective: Patient states his abdominal pain has decreased. He is having flatus. He is hungry.  Objective: Vital signs in last 24 hours: Temp:  [97.6 F (36.4 C)-97.9 F (36.6 C)] 97.6 F (36.4 C) (03/20 0511) Pulse Rate:  [55-59] 55 (03/20 0511) Resp:  [16-20] 18 (03/20 0511) BP: (108-142)/(67-87) 142/87 mmHg (03/20 0511) SpO2:  [96 %-100 %] 100 % (03/20 0511) Last BM Date: 02/17/14  Intake/Output from previous day: 03/19 0701 - 03/20 0700 In: 2905 [I.V.:2205; IV Piggyback:700] Out: 3600 [Urine:2100; Emesis/NG output:1500] Intake/Output this shift:    General appearance: alert, cooperative and no distress GI: soft, non-tender; bowel sounds normal; no masses,  no organomegaly  Lab Results:   Recent Labs  02/20/14 0515 02/21/14 0515  WBC 8.9 6.9  HGB 13.0 13.1  HCT 37.9* 38.7*  PLT 231 236   BMET  Recent Labs  02/20/14 0515 02/21/14 0515  NA 138 139  K 3.8 4.0  CL 102 104  CO2 29 27  GLUCOSE 105* 94  BUN 12 8  CREATININE 0.89 0.77  CALCIUM 8.0* 8.5   PT/INR No results found for this basename: LABPROT, INR,  in the last 72 hours  Studies/Results: No results found.  Anti-infectives: Anti-infectives   Start     Dose/Rate Route Frequency Ordered Stop   02/20/14 0100  ciprofloxacin (CIPRO) IVPB 400 mg     400 mg 200 mL/hr over 60 Minutes Intravenous Every 12 hours 02/19/14 1522     02/19/14 1000  ciprofloxacin (CIPRO) IVPB 400 mg  Status:  Discontinued     400 mg 200 mL/hr over 60 Minutes Intravenous Every 12 hours 02/19/14 0836 02/19/14 1522   02/19/14 0900  metroNIDAZOLE (FLAGYL) IVPB 500 mg     500 mg 100 mL/hr over 60 Minutes Intravenous Every 8 hours 02/19/14 0836        Assessment/Plan: Impression: Abdominal pain of unknown etiology, resolving. Partial small bowel obstruction, resolving. Plan: We'll remove gastric tube and advance diet as tolerated. Social services has been involved in getting housing and followup medical care for the  patient.  LOS: 2 days    [name] A 02/21/2014

## 2014-02-21 NOTE — Clinical Social Work Note (Signed)
CSW received call from Salima with [facility] and provided several contacts for veteran assistance for pt, including supportive services [phone] , [facility] [phone], and Crisis Line for [facility] [phone]. Pt given these referrals. CSW left voicemail for [facility] social worker requesting return call regarding pt. Pt's friend plans to come to hospital today with paperwork and will continue VA enrollment process after that. He feels he can complete on his own, but is agreeable to CSW follow up later today if needed. Pt states Norval Gableatricia Gilley is to also follow up today with him. Pt appreciative of assistance. NG tube removed and diet is being advanced.   Derenda FennelKara Emmah Bratcher, [place] [phone]

## 2014-02-21 NOTE — Clinical Social Work Note (Signed)
CSW met with pt again this afternoon. He said it will be later today before he has the paperwork needed as he is waiting on his sister to fax it to his friend. Pt states at d/c he plans to return to his tent. He plans to follow up with Mayra Reel as she told him she would be off today.   Benay Pike, [place]

## 2014-02-21 NOTE — Care Management Note (Addendum)
    Page 1 of 1   02/24/2014     10:44:25 AM   CARE MANAGEMENT NOTE 02/24/2014  Patient:  Betsey HolidayBELCHER,Iker O   Account Number:  [id number]  Date Initiated:  02/21/2014  Documentation initiated by:  Sharrie RothmanBLACKWELL,Bohden Dung C  Subjective/Objective Assessment:   Pt admitted with SBO. Pt is homeless and lives in a tent. Pt is working with CSW and Norval GablePatricia Gilley, Engineer, watercongregational nurse, for shelter and medical care. Pt wants to return to his tent at discharge.     Action/Plan:   No CM needs noted. Pt not anticipated to discharge on any meds so no MATCH voucher is needed. Financial counselor aware of self pay status.   Anticipated DC Date:  02/24/2014   Anticipated DC Plan:  HOME/SELF CARE  In-house referral  Clinical Social Worker  Artistinancial Counselor      DC Planning Services  CM consult      Choice offered to / List presented to:             Status of service:  Completed, signed off Medicare Important Message given?   (If response is "NO", the following Medicare IM given date fields will be blank) Date Medicare IM given:   Date Additional Medicare IM given:    Discharge Disposition:  HOME/SELF CARE  Per UR Regulation:    If discussed at Long Length of Stay Meetings, dates discussed:    Comments:  02/24/14 1040 Arlyss Queenammy Cassie Henkels, RN BSN CM Pt discharged home today. Congregational Nurse, Elease HashimotoPatricia, will [facility]. No MATCH voucher needed. No other CM needs noted.  02/21/14 1410 Arlyss Queenammy Nami Strawder, RN BSN CM

## 2014-02-22 MED ORDER — POLYETHYLENE GLYCOL 3350 17 G PO PACK
17.0000 g | PACK | Freq: Two times a day (BID) | ORAL | Status: DC
  Administered 2014-02-22 – 2014-02-23 (×3): 17 g via ORAL
  Filled 2014-02-22 (×3): qty 1

## 2014-02-22 MED ORDER — MAGNESIUM HYDROXIDE 400 MG/5ML PO SUSP
30.0000 mL | Freq: Two times a day (BID) | ORAL | Status: DC
  Administered 2014-02-22 – 2014-02-23 (×3): 30 mL via ORAL
  Filled 2014-02-22 (×3): qty 30

## 2014-02-22 NOTE — Progress Notes (Signed)
  Subjective: No abdominal pain. Has not had a bowel movement yet. Tolerating clear liquid diet well.  Objective: Vital signs in last 24 hours: Temp:  [98 F (36.7 C)-98.6 F (37 C)] 98 F (36.7 C) (03/21 0603) Pulse Rate:  [58-64] 58 (03/21 0603) Resp:  [18-20] 20 (03/21 0603) BP: (111-128)/(72-88) 128/88 mmHg (03/21 0603) SpO2:  [98 %-100 %] 100 % (03/21 0603) Weight:  [52.2 kg (115 lb 1.3 oz)] 52.2 kg (115 lb 1.3 oz) (03/21 0603) Last BM Date: 02/17/14  Intake/Output from previous day: 03/20 0701 - 03/21 0700 In: 3080.8 [P.O.:840; I.V.:1540.8; IV Piggyback:700] Out: 3525 [Urine:3425; Emesis/NG output:100] Intake/Output this shift: Total I/O In: -  Out: 600 [Urine:600]  General appearance: alert, cooperative and no distress Resp: clear to auscultation bilaterally Cardio: regular rate and rhythm, S1, S2 normal, no murmur, click, rub or gallop GI: soft, non-tender; bowel sounds normal; no masses,  no organomegaly  Lab Results:   Recent Labs  02/20/14 0515 02/21/14 0515  WBC 8.9 6.9  HGB 13.0 13.1  HCT 37.9* 38.7*  PLT 231 236   BMET  Recent Labs  02/20/14 0515 02/21/14 0515  NA 138 139  K 3.8 4.0  CL 102 104  CO2 29 27  GLUCOSE 105* 94  BUN 12 8  CREATININE 0.89 0.77  CALCIUM 8.0* 8.5   PT/INR No results found for this basename: LABPROT, INR,  in the last 72 hours  Studies/Results: No results found.  Anti-infectives: Anti-infectives   Start     Dose/Rate Route Frequency Ordered Stop   02/20/14 0100  ciprofloxacin (CIPRO) IVPB 400 mg     400 mg 200 mL/hr over 60 Minutes Intravenous Every 12 hours 02/19/14 1522     02/19/14 1000  ciprofloxacin (CIPRO) IVPB 400 mg  Status:  Discontinued     400 mg 200 mL/hr over 60 Minutes Intravenous Every 12 hours 02/19/14 0836 02/19/14 1522   02/19/14 0900  metroNIDAZOLE (FLAGYL) IVPB 500 mg     500 mg 100 mL/hr over 60 Minutes Intravenous Every 8 hours 02/19/14 0836        Assessment/Plan: Impression:  Small bowel obstruction, waiting for return of bowel function. Tolerating clear liquid diet well. Plan: Continue current course. No need for acute surgical intervention at this time.  LOS: 3 days    Kyrstyn Greear A 02/22/2014

## 2014-02-23 MED ORDER — SODIUM CHLORIDE 0.9 % IJ SOLN
3.0000 mL | Freq: Two times a day (BID) | INTRAMUSCULAR | Status: DC
  Administered 2014-02-23 (×2): 3 mL via INTRAVENOUS

## 2014-02-23 MED ORDER — SODIUM CHLORIDE 0.9 % IJ SOLN
3.0000 mL | INTRAMUSCULAR | Status: DC | PRN
  Administered 2014-02-23: 3 mL via INTRAVENOUS

## 2014-02-23 MED ORDER — SODIUM CHLORIDE 0.9 % IV SOLN
250.0000 mL | INTRAVENOUS | Status: DC | PRN
  Administered 2014-02-23: 250 mL via INTRAVENOUS

## 2014-02-23 NOTE — Progress Notes (Signed)
  Subjective: Patient feels better. He is now moving his bowels. He is been advanced to a regular diet. Minimal abdominal pain noted.  Objective: Vital signs in last 24 hours: Temp:  [97.4 F (36.3 C)-98.3 F (36.8 C)] 97.5 F (36.4 C) (03/22 0657) Pulse Rate:  [62-85] 85 (03/22 0657) Resp:  [20] 20 (03/22 0657) BP: (117-137)/(71-83) 117/71 mmHg (03/22 0657) SpO2:  [98 %-100 %] 98 % (03/22 0657) Last BM Date: 02/23/14  Intake/Output from previous day: 03/21 0701 - 03/22 0700 In: 2980 [P.O.:1080; I.V.:1200; IV Piggyback:700] Out: 2301 [Urine:2300; Stool:1] Intake/Output this shift: Total I/O In: 100 [IV Piggyback:100] Out: -   General appearance: alert, cooperative and no distress Resp: clear to auscultation bilaterally Cardio: regular rate and rhythm, S1, S2 normal, no murmur, click, rub or gallop GI: soft, non-tender; bowel sounds normal; no masses,  no organomegaly  Lab Results:   Recent Labs  02/21/14 0515  WBC 6.9  HGB 13.1  HCT 38.7*  PLT 236   BMET  Recent Labs  02/21/14 0515  NA 139  K 4.0  CL 104  CO2 27  GLUCOSE 94  BUN 8  CREATININE 0.77  CALCIUM 8.5   PT/INR No results found for this basename: LABPROT, INR,  in the last 72 hours  Studies/Results: No results found.  Anti-infectives: Anti-infectives   Start     Dose/Rate Route Frequency Ordered Stop   02/20/14 0100  ciprofloxacin (CIPRO) IVPB 400 mg     400 mg 200 mL/hr over 60 Minutes Intravenous Every 12 hours 02/19/14 1522     02/19/14 1000  ciprofloxacin (CIPRO) IVPB 400 mg  Status:  Discontinued     400 mg 200 mL/hr over 60 Minutes Intravenous Every 12 hours 02/19/14 0836 02/19/14 1522   02/19/14 0900  metroNIDAZOLE (FLAGYL) IVPB 500 mg     500 mg 100 mL/hr over 60 Minutes Intravenous Every 8 hours 02/19/14 0836        Assessment/Plan: Impression: Small bowel obstruction, resolved Plan: Anticipate discharge in next 24-48 hours. His social situation is being addressed by  social services as [place]. Please see their note concerning [place] in [place] [employer]. We'll also try and arrange living and food arrangements for him.  LOS: 4 days    Luan Urbani A 02/23/2014

## 2014-02-24 NOTE — Clinical Social Work Note (Signed)
Discussed patient w Jolaine ArtistP Gilley, she will follow up w him using his friend Gracie's phone number.  Patient given several referrals, including Elease HashimotoPatricia Gilley/Congregational Nurse - [phone], [facility] for [facility] - [phone], [address] [place] - [phone], [facility] - [phone], [facility] [place] [phone], Supportive Services w [facility] [place]- [phone], [facility] - [place] [phone], Crisis Line for [facility] [phone].  Patient says he will follow up on getting Food Stamps phone, needs appropriate address for this.  Patient states he is going to work on following up w various resources to stabilize his housing and income needs.  Santa GeneraAnne Hershal Eriksson, LCSW Clinical Social Worker [phone])

## 2014-02-24 NOTE — Discharge Instructions (Signed)
Intestinal Obstruction °An intestinal obstruction is a blockage of the intestine. It can be caused by a physical blockage or by a problem of abnormal function of the intestine.  °CAUSES  °· Adhesions from previous surgeries. °· Cancer or tumor. °· A hernia, which is a condition in which a portion of the bowel bulges out through an opening or weakness in the abdomen. This sometimes squeezes the bowel. °· A swallowed object. °· Blockage (impaction) with worms is common in third world countries. °· A twisting of the bowel or telescoping of a portion of the bowel into another portion (intussusceptions). °· Anything that stops food from going through from the stomach to the anus. °SYMPTOMS  °Symptoms of bowel obstruction may include abdominal bloating, nausea, vomiting, explosive diarrhea, or explosive stool. You may not be able to hear your normal bowel sounds (such as "growling in your stomach"). You may also stop having bowel movements or passing gas. °DIAGNOSIS  °Usually this condition is diagnosed with a history and an examination. Often, lab studies (blood work) and X-rays may be used to find the cause. °TREATMENT  °The main treatment for this condition is to rest the intestine. Often, the obstruction may relieve itself and allow the intestine to start working again. Think of the intestine like a balloon that is blown up (filled with trapped food and water that has squeezed into a hole or area that it cannot get through).  °· If the obstruction is complete, a nasogastric (NG) tube is passed through the nose and into the stomach. It is then connected to suction to keep the stomach emptied out. This also helps treat the nausea and vomiting. °· If there is an imbalance in the electrolytes, they are corrected with intravenous fluids. These fluids have the proper chemicals in them to correct the problem. °· If the reason for the blockage does not get better with conservative (nonsurgical) treatment, surgery may be  necessary. Sometimes, surgery is done immediately if your surgeon knows that the problem is not going to get better with conservative treatment. °PROGNOSIS  °Depending on what the problem is, most of these problems can be treated by your caregivers with good results. Your caregiver will discuss with you the best course of action to take. °FOLLOWING SURGERY °Seek immediate medical attention if you have: °· Increasing abdominal pain, repeated vomiting, dehydration, or fainting. °· Severe weakness, chest pain, or back pain. °· Blood in your vomit or stool. °· Tarry stool. °Document Released: 02/11/2004 Document Revised: 03/18/2013 Document Reviewed: 07/11/2008 °ExitCare® Patient Information ©2014 [employer]. ° °

## 2014-02-24 NOTE — Discharge Summary (Signed)
Physician Discharge Summary  Patient ID: [name] MRN: [id number] DOB/AGE: 55/01/1961 55 y.o.  Admit date: 02/19/2014 Discharge date: 02/24/2014  Admission Diagnoses: Small bowel obstruction, malnutrition of moderate degree  Discharge Diagnoses: Same, resolved Active Problems:   Small bowel obstruction   Malnutrition of moderate degree   Discharged Condition: good  Hospital Course: Patient is a 53 year old white male who is homeless who presented to the emergency room with worsening abdominal pain and nausea. CT scan the abdomen was performed which revealed a partial bowel obstruction of unknown etiology. Patient has never had surgery. An NG tube was placed the patient was admitted to the surgical service for further evaluation treatment. His bowel decompressed nicely and his function returned after a few days. The etiology of his bowel obstructions unknown. His diet was advanced at difficulty. He is being discharged from the hospital in good improving condition. Social services has been working with the patient as he is homeless and lives in a visiting given various names and numbers of services available to help him out.  Discharge Exam: Blood pressure 114/74, pulse 50, temperature 97.9 F (36.6 C), temperature source Oral, resp. rate 20, height 5\' 6"  (1.676 m), weight 52.4 kg (115 lb 8.3 oz), SpO2 98.00%. General appearance: alert, cooperative and no distress Resp: clear to auscultation bilaterally Cardio: regular rate and rhythm, S1, S2 normal, no murmur, click, rub or gallop GI: soft, non-tender; bowel sounds normal; no masses,  no organomegaly  Disposition: 01-Home or Self Care     Medication List         albuterol 108 (90 BASE) MCG/ACT inhaler  Commonly known as:  PROVENTIL HFA;VENTOLIN HFA  Inhale 2 puffs into the lungs every 6 (six) hours as needed for wheezing or shortness of breath.           Follow-up Information   Follow up with Dalia HeadingJENKINS,Janssen Zee A, MD. (As  needed)    Specialty:  General Surgery   Contact information:   [address] [place] [id number] [phone]       Signed: Franky MachoJENKINS,Haasini Patnaude A 02/24/2014, 10:31 AM

## 2014-02-24 NOTE — Clinical Social Work Note (Signed)
[place] DD [place], says he will take [place] of [place] in [place] by himself.  P Gilley from Congregational Nurse informed of possible discharge plans, patient says he will return to tent at discharge.  Santa GeneraAnne Kenton Fortin, LCSW Clinical Social Worker [phone])

## 2014-02-24 NOTE — Progress Notes (Signed)
IV removed, site WNL.  Pt given d/c instructions.  No new medications prescribed for discharge.   F/U appointment to be made as needed. Pt states he does not need an appointment at this time. Pt is stable at this time and desires to be discharged. Case management and SW have worked with patient.  Pt taken to main entrance in wheelchair by staff member.

## 2014-02-24 NOTE — Progress Notes (Deleted)
Patient doing well. Okay for discharge from my standpoint. No need for followup in my office unless something arises.

## 2014-02-25 NOTE — Progress Notes (Signed)
UR chart review completed.  

## 2014-04-04 ENCOUNTER — Emergency Department (HOSPITAL_COMMUNITY)
Admission: EM | Admit: 2014-04-04 | Discharge: 2014-04-05 | Disposition: A | Discharge status: Home / Self Care | Payer: Self-pay | Attending: Emergency Medicine | Admitting: Emergency Medicine

## 2014-04-04 ENCOUNTER — Encounter (HOSPITAL_COMMUNITY): Payer: Self-pay | Admitting: Emergency Medicine

## 2014-04-04 ENCOUNTER — Emergency Department (HOSPITAL_COMMUNITY): Payer: Self-pay

## 2014-04-04 DIAGNOSIS — Z8719 Personal history of other diseases of the digestive system: Secondary | ICD-10-CM | POA: Insufficient documentation

## 2014-04-04 DIAGNOSIS — F10929 Alcohol use, unspecified with intoxication, unspecified: Secondary | ICD-10-CM

## 2014-04-04 DIAGNOSIS — I10 Essential (primary) hypertension: Secondary | ICD-10-CM | POA: Insufficient documentation

## 2014-04-04 DIAGNOSIS — F17200 Nicotine dependence, unspecified, uncomplicated: Secondary | ICD-10-CM | POA: Insufficient documentation

## 2014-04-04 DIAGNOSIS — R1084 Generalized abdominal pain: Secondary | ICD-10-CM | POA: Insufficient documentation

## 2014-04-04 DIAGNOSIS — G8929 Other chronic pain: Secondary | ICD-10-CM | POA: Insufficient documentation

## 2014-04-04 DIAGNOSIS — J4489 Other specified chronic obstructive pulmonary disease: Secondary | ICD-10-CM | POA: Insufficient documentation

## 2014-04-04 DIAGNOSIS — R1110 Vomiting, unspecified: Secondary | ICD-10-CM | POA: Insufficient documentation

## 2014-04-04 DIAGNOSIS — J449 Chronic obstructive pulmonary disease, unspecified: Secondary | ICD-10-CM | POA: Insufficient documentation

## 2014-04-04 DIAGNOSIS — F1010 Alcohol abuse, uncomplicated: Secondary | ICD-10-CM | POA: Insufficient documentation

## 2014-04-04 DIAGNOSIS — R109 Unspecified abdominal pain: Secondary | ICD-10-CM

## 2014-04-04 HISTORY — DX: Chronic obstructive pulmonary disease, unspecified: J44.9

## 2014-04-04 HISTORY — DX: Unspecified intestinal obstruction, unspecified as to partial versus complete obstruction: K56.609

## 2014-04-04 LAB — COMPREHENSIVE METABOLIC PANEL
ALT: 30 U/L (ref 0–53)
AST: 28 U/L (ref 0–37)
Albumin: 4.2 g/dL (ref 3.5–5.2)
Alkaline Phosphatase: 80 U/L (ref 39–117)
BUN: 10 mg/dL (ref 6–23)
CALCIUM: 9.6 mg/dL (ref 8.4–10.5)
CHLORIDE: 105 meq/L (ref 96–112)
CO2: 23 meq/L (ref 19–32)
Creatinine, Ser: 0.77 mg/dL (ref 0.50–1.35)
GFR calc Af Amer: >90 mL/min (ref >90)
Glucose, Bld: 99 mg/dL (ref 70–99)
Potassium: 3.7 mEq/L (ref 3.7–5.3)
Sodium: 142 mEq/L (ref 137–147)
Total Protein: 7.5 g/dL (ref 6.0–8.3)

## 2014-04-04 LAB — URINALYSIS, ROUTINE W REFLEX MICROSCOPIC
Bilirubin Urine: NEGATIVE
GLUCOSE, UA: NEGATIVE mg/dL
HGB URINE DIPSTICK: NEGATIVE
KETONES UR: NEGATIVE mg/dL
Leukocytes, UA: NEGATIVE
Nitrite: NEGATIVE
PH: 6 (ref 5.0–8.0)
PROTEIN: NEGATIVE mg/dL
Specific Gravity, Urine: <1.005 — ABNORMAL LOW (ref 1.005–1.030)
Urobilinogen, UA: 0.2 mg/dL (ref 0.0–1.0)

## 2014-04-04 LAB — ETHANOL: ALCOHOL ETHYL (B): 251 mg/dL — AB (ref 0–11)

## 2014-04-04 LAB — CBC WITH DIFFERENTIAL/PLATELET
BASOS ABS: 0 10*3/uL (ref 0.0–0.1)
BASOS PCT: 0 % (ref 0–1)
EOS PCT: 2 % (ref 0–5)
Eosinophils Absolute: 0.2 10*3/uL (ref 0.0–0.7)
HEMATOCRIT: 42.4 % (ref 39.0–52.0)
HEMOGLOBIN: 14.5 g/dL (ref 13.0–17.0)
LYMPHS PCT: 35 % (ref 12–46)
Lymphs Abs: 2.5 10*3/uL (ref 0.7–4.0)
MCH: 33 pg (ref 26.0–34.0)
MCHC: 34.2 g/dL (ref 30.0–36.0)
MCV: 96.6 fL (ref 78.0–100.0)
MONO ABS: 0.7 10*3/uL (ref 0.1–1.0)
Monocytes Relative: 10 % (ref 3–12)
NEUTROS ABS: 3.7 10*3/uL (ref 1.7–7.7)
Neutrophils Relative %: 53 % (ref 43–77)
Platelets: 340 10*3/uL (ref 150–400)
RBC: 4.39 MIL/uL (ref 4.22–5.81)
RDW: 13.6 % (ref 11.5–15.5)
WBC: 7.1 10*3/uL (ref 4.0–10.5)

## 2014-04-04 LAB — LACTIC ACID, PLASMA: LACTIC ACID, VENOUS: 1.6 mmol/L (ref 0.5–2.2)

## 2014-04-04 LAB — LIPASE, BLOOD: LIPASE: 22 U/L (ref 11–59)

## 2014-04-04 MED ORDER — IOHEXOL 300 MG/ML  SOLN
50.0000 mL | Freq: Once | INTRAMUSCULAR | Status: AC | PRN
  Administered 2014-04-04: 50 mL via ORAL

## 2014-04-04 MED ORDER — FAMOTIDINE IN NACL 20-0.9 MG/50ML-% IV SOLN
20.0000 mg | Freq: Once | INTRAVENOUS | Status: AC
  Administered 2014-04-04: 20 mg via INTRAVENOUS
  Filled 2014-04-04: qty 50

## 2014-04-04 MED ORDER — ONDANSETRON HCL 4 MG/2ML IJ SOLN
4.0000 mg | INTRAMUSCULAR | Status: DC | PRN
  Administered 2014-04-04: 4 mg via INTRAVENOUS
  Filled 2014-04-04: qty 2

## 2014-04-04 MED ORDER — SODIUM CHLORIDE 0.9 % IV SOLN
INTRAVENOUS | Status: DC
  Administered 2014-04-04: 22:00:00 via INTRAVENOUS

## 2014-04-04 MED ORDER — IOHEXOL 300 MG/ML  SOLN
100.0000 mL | Freq: Once | INTRAMUSCULAR | Status: AC | PRN
  Administered 2014-04-04: 100 mL via INTRAVENOUS

## 2014-04-04 MED ORDER — FENTANYL CITRATE 0.05 MG/ML IJ SOLN
25.0000 ug | INTRAMUSCULAR | Status: AC | PRN
  Administered 2014-04-04 (×2): 25 ug via INTRAVENOUS
  Filled 2014-04-04 (×2): qty 2

## 2014-04-04 NOTE — ED Notes (Signed)
Pt reports stomach hurting for the past month, was seen & admitted in March for a small bowel obstruction.

## 2014-04-04 NOTE — ED Provider Notes (Signed)
CSN: [id number]     Arrival date & time 04/04/14  2046 History   First MD Initiated Contact with Patient 04/04/14 2057     Chief Complaint  Patient presents with  . Abdominal Pain     HPI Pt was seen at 2055.  Per pt, c/o gradual onset and persistence of constant generalized abd "pain" that began today.  Has been associated with nausea and "gagging."  Describes the abd pain as "twisting" and "like when they told me I had a bowel blockage." Pt states he "drank a beer today" to "help with the pain" without improvement in his symptoms. Pt has hx of admission in 02/2014 for SBO.  Denies vomiting/diarrhea, no fevers, no back pain, no rash, no CP/SOB, no black or blood in stools or emesis.        Past Medical History  Diagnosis Date  . Hypertension   . COPD (chronic obstructive pulmonary disease)   . Small bowel obstruction 02/2014   Past Surgical History  Procedure Laterality Date  . Orif mandibular fracture  2007    History  Substance Use Topics  . Smoking status: Current Every Day Smoker  . Smokeless tobacco: Not on file  . Alcohol Use: Yes     Comment: "ETOH drank 1 beer tonight"    Review of Systems ROS: Statement: All systems negative except as marked or noted in the HPI; Constitutional: Negative for fever and chills. ; ; Eyes: Negative for eye pain, redness and discharge. ; ; ENMT: Negative for ear pain, hoarseness, nasal congestion, sinus pressure and sore throat. ; ; Cardiovascular: Negative for chest pain, palpitations, diaphoresis, dyspnea and peripheral edema. ; ; Respiratory: Negative for cough, wheezing and stridor. ; ; Gastrointestinal: +nausea, "gagging," abd pain. Negative for vomiting, diarrhea, blood in stool, hematemesis, jaundice and rectal bleeding. . ; ; Genitourinary: Negative for dysuria, flank pain and hematuria. ; ; Musculoskeletal: Negative for back pain and neck pain. Negative for swelling and trauma.; ; Skin: Negative for pruritus, rash, abrasions, blisters,  bruising and skin lesion.; ; Neuro: Negative for headache, lightheadedness and neck stiffness. Negative for weakness, altered level of consciousness , altered mental status, extremity weakness, paresthesias, involuntary movement, seizure and syncope.      Allergies  Review of patient's allergies indicates no known allergies.  Home Medications   Prior to Admission medications   Medication Sig Start Date End Date Taking? Authorizing Provider  albuterol (PROVENTIL HFA;VENTOLIN HFA) 108 (90 BASE) MCG/ACT inhaler Inhale 2 puffs into the lungs every 6 (six) hours as needed for wheezing or shortness of breath.    Historical Provider, MD   BP 131/103  Pulse 55  Temp(Src) 97.8 F (36.6 C) (Oral)  Resp 20  Ht 5\' 7"  (1.702 m)  Wt 120 lb (54.432 kg)  BMI 18.79 kg/m2  SpO2 100% Physical Exam 2100: Physical examination:  Nursing notes reviewed; Vital signs and O2 SAT reviewed;  Constitutional: Well developed, Well nourished, Well hydrated, In no acute distress; Head:  Normocephalic, atraumatic; Eyes: EOMI, PERRL, No scleral icterus; ENMT: Mouth and pharynx normal, Mucous membranes moist; Neck: Supple, Full range of motion, No lymphadenopathy; Cardiovascular: Regular rate and rhythm, No gallop; Respiratory: Breath sounds clear & equal bilaterally, No rales, rhonchi, wheezes.  Speaking full sentences with ease, Normal respiratory effort/excursion; Chest: Nontender, Movement normal; Abdomen: Soft, +diffuse tenderness to palp. Nondistended, Normal bowel sounds; Genitourinary: No CVA tenderness; Extremities: Pulses normal, No tenderness, No edema, No calf edema or asymmetry.; Neuro: AA&Ox3, Major CN grossly intact.  Speech clear. No gross focal motor or sensory deficits in extremities.; Skin: Color normal, Warm, Dry.   ED Course  Procedures     EKG Interpretation None      MDM  MDM Reviewed: previous chart, nursing note and vitals Reviewed previous: labs and CT scan Interpretation:  labs   Results for orders placed during the hospital encounter of 04/04/14  ETHANOL      Result Value Ref Range   Alcohol, Ethyl (B) 251 (*) 0 - 11 mg/dL  URINALYSIS, ROUTINE W REFLEX MICROSCOPIC      Result Value Ref Range   Color, Urine STRAW (*) YELLOW   APPearance CLEAR  CLEAR   Specific Gravity, Urine <1.005 (*) 1.005 - 1.030   pH 6.0  5.0 - 8.0   Glucose, UA NEGATIVE  NEGATIVE mg/dL   Hgb urine dipstick NEGATIVE  NEGATIVE   Bilirubin Urine NEGATIVE  NEGATIVE   Ketones, ur NEGATIVE  NEGATIVE mg/dL   Protein, ur NEGATIVE  NEGATIVE mg/dL   Urobilinogen, UA 0.2  0.0 - 1.0 mg/dL   Nitrite NEGATIVE  NEGATIVE   Leukocytes, UA NEGATIVE  NEGATIVE  CBC WITH DIFFERENTIAL      Result Value Ref Range   WBC 7.1  4.0 - 10.5 K/uL   RBC 4.39  4.22 - 5.81 MIL/uL   Hemoglobin 14.5  13.0 - 17.0 g/dL   HCT 16.142.4  09.639.0 - 04.552.0 %   MCV 96.6  78.0 - 100.0 fL   MCH 33.0  26.0 - 34.0 pg   MCHC 34.2  30.0 - 36.0 g/dL   RDW 40.913.6  81.111.5 - 91.415.5 %   Platelets 340  150 - 400 K/uL   Neutrophils Relative % 53  43 - 77 %   Neutro Abs 3.7  1.7 - 7.7 K/uL   Lymphocytes Relative 35  12 - 46 %   Lymphs Abs 2.5  0.7 - 4.0 K/uL   Monocytes Relative 10  3 - 12 %   Monocytes Absolute 0.7  0.1 - 1.0 K/uL   Eosinophils Relative 2  0 - 5 %   Eosinophils Absolute 0.2  0.0 - 0.7 K/uL   Basophils Relative 0  0 - 1 %   Basophils Absolute 0.0  0.0 - 0.1 K/uL  COMPREHENSIVE METABOLIC PANEL      Result Value Ref Range   Sodium 142  137 - 147 mEq/L   Potassium 3.7  3.7 - 5.3 mEq/L   Chloride 105  96 - 112 mEq/L   CO2 23  19 - 32 mEq/L   Glucose, Bld 99  70 - 99 mg/dL   BUN 10  6 - 23 mg/dL   Creatinine, Ser 7.820.77  0.50 - 1.35 mg/dL   Calcium 9.6  8.4 - 95.610.5 mg/dL   Total Protein 7.5  6.0 - 8.3 g/dL   Albumin 4.2  3.5 - 5.2 g/dL   AST 28  0 - 37 U/L   ALT 30  0 - 53 U/L   Alkaline Phosphatase 80  39 - 117 U/L   Total Bilirubin <0.2 (*) 0.3 - 1.2 mg/dL   GFR calc non Af Amer >90  >90 mL/min   GFR calc Af Amer  >90  >90 mL/min  LIPASE, BLOOD      Result Value Ref Range   Lipase 22  11 - 59 U/L    2200:  Etoh level elevated; labs and Udip otherwise reassuring. Lactic acid level, CXR, and CT A/P pending.  Dispo  based on results. Sign out to Dr. Fonnie Jarvis.         Laray Anger, DO 04/04/14 2201

## 2014-04-05 NOTE — Discharge Instructions (Signed)
Stop alcohol use.  Clear liquid diet next 12 hours. Alcohol Intoxication Alcohol intoxication occurs when you drink enough alcohol that it affects your ability to function. It can be mild or very severe. Drinking a lot of alcohol in a short time is called binge drinking. This can be very harmful. Drinking alcohol can also be more dangerous if you are taking medicines or other drugs. Some of the effects caused by alcohol may include:  Loss of coordination.  Changes in mood and behavior.  Unclear thinking.  Trouble talking (slurred speech).  Throwing up (vomiting).  Confusion.  Slowed breathing.  Twitching and shaking (seizures).  Loss of consciousness. HOME CARE  Do not drive after drinking alcohol.  Drink enough water and fluids to keep your pee (urine) clear or pale yellow. Avoid caffeine.  Only take medicine as told by your doctor. GET HELP IF:  You throw up (vomit) many times.  You do not feel better after a few days.  You frequently have alcohol intoxication. Your doctor can help decide if you should see a substance use treatment counselor. GET HELP RIGHT AWAY IF:  You become shaky when you stop drinking.  You have twitching and shaking.  You throw up blood. It may look bright red or like coffee grounds.  You notice blood in your poop (bowel movements).  You become lightheaded or pass out (faint). MAKE SURE YOU:   Understand these instructions.  Will watch your condition.  Will get help right away if you are not doing well or get worse. Document Released: 05/09/2008 Document Revised: 07/24/2013 Document Reviewed: 04/26/2013 [facility] Patient Information 2014 [place] [place].   Abdominal (belly) pain can be caused by many things. Your caregiver performed an examination and possibly ordered blood/urine tests and imaging (CT scan, x-rays, ultrasound). Many cases can be observed and treated at home after initial evaluation in the emergency department. Even  though you are being discharged home, abdominal pain can be unpredictable. Therefore, you need a repeated exam if your pain does not resolve, returns, or worsens. Most patients with abdominal pain don't have to be admitted to the hospital or have surgery, but serious problems like appendicitis and gallbladder attacks can start out as nonspecific pain. Many abdominal conditions cannot be diagnosed in one visit, so follow-up evaluations are very important. SEEK IMMEDIATE MEDICAL ATTENTION IF: The pain does not go away or becomes severe.  A temperature above 101 develops.  Repeated vomiting occurs (multiple episodes).  The pain becomes localized to portions of the abdomen. The right side could possibly be appendicitis. In an adult, the left lower portion of the abdomen could be colitis or diverticulitis.  Blood is being passed in stools or vomit (bright red or black tarry stools).  Return also if you develop chest pain, difficulty breathing, dizziness or fainting, or become confused, poorly responsive, or inconsolable (young children).  Emergency Department Resource Guide 1) Find a Doctor and Pay Out of Pocket Although you won't have to find out who is covered by your insurance plan, it is a good idea to ask around and get recommendations. You will then need to call the office and see if the doctor you have chosen will accept you as a new patient and what types of options they offer for patients who are self-pay. Some doctors offer discounts or will set up payment plans for their patients who do not have insurance, but you will need to ask so you aren't surprised when you get to your appointment.  2) Contact Your Local Health Department Not all health departments have doctors that can see patients for sick visits, but many do, so it is worth a call to see if yours does. If you don't know where your local health department is, you can check in your phone book. The CDC also has a tool to help you locate your  state's health department, and many state websites also have listings of all of their local health departments.  3) Find a Walk-in Clinic If your illness is not likely to be very severe or complicated, you may want to try a walk in clinic. These are popping up all over the country in pharmacies, drugstores, and shopping centers. They're usually staffed by nurse practitioners or physician assistants that have been trained to treat common illnesses and complaints. They're usually fairly quick and inexpensive. However, if you have serious medical issues or chronic medical problems, these are probably not your best option.  No Primary Care Doctor: - Call Health Connect at  [phone] - they can help you locate a primary care doctor that  accepts your insurance, provides certain services, etc. - Physician Referral Service- [phone]  Chronic Pain Problems: Organization         Address  Phone   Notes  [facility]  [phone] Patients need to be referred by their primary care doctor.   Medication Assistance: Organization         Address  Phone   Notes  [facility] [address] [place] [place], [place] [address] [phone] --Must be a resident of [facility] -- Must have NO insurance coverage whatsoever (no Medicaid/ Medicare, etc.) -- The pt. MUST have a primary care doctor that directs their care regularly and follows them in the community   [facility]  [phone]   [employer]  [phone]    Agencies that provide inexpensive medical care: Organization         Address  Phone   Notes  [facility]  [phone]   [facility]    [phone]   [facility] [address] [place], [place] [address] [phone]   [facility] [address] [place] [phone]   Planned Parenthood    [phone]   [facility]    [phone]   [facility]  [address], [place] Phone:  [phone], Fax:  [phone] Hours of Operation:  9 am - 6 pm, M-F.  Also accepts Medicaid/Medicare and self-pay.  [facility]  [address], [place] Phone: [phone], Fax: [phone]. Hours of Operation:  8:30 am - 5:30 pm, M-F.  Also accepts Medicaid and self-pay.  [facility] [place] [address], [place] Phone: [phone]   [facility] [address] Natasha Bence [place], [place] [phone], Ext. [phone] Mondays & Thursdays: 7-9 AM.  First 15 patients are seen on a first come, first serve basis.    Medicaid-accepting [facility] Providers:  Organization         Address  Phone   Notes  [facility] [address], [place] [phone] Also accepts self-pay patients.  [facility] [address] Laurell Josephs [place], [place]  [phone]   [facility] [address]  [place], [place] [phone]   [facility] [address] [place] [phone]   Renaye Rakers [address], [place]   [phone] Only accepts [place] patients after they have their name applied to their card.   Self-Pay (no insurance) in [facility]:  Organization         Address  Phone   Notes  Sickle Cell Patients, [facility] [address] [place], [place] [phone]   [facility] [address] [place], [place] [phone]   [facility]  [address], [place] [phone]   [facility]/Dr. Osei-Bonsu  [address], [place] or [address], [place] [phone] Phone number for both [place] and [place] locations is the same.  [facility] [address] [place] [phone]   [facility] [address], [place] or [address] [phone]   [facility] [address], [place] [phone], phone; [phone], fax Sees patients 1st and 3rd Saturday of every month.  Must not qualify for public or private insurance (i.e. Medicaid, Medicare, [place] Health Choice, Veterans' Benefits)  Household income should be no more than 200% of the poverty level The clinic cannot treat you if you are pregnant or think you are pregnant  Sexually transmitted diseases are not treated at the clinic.   Dental Care: Organization         Address  Phone  Notes  [facility] Department of [facility] [address] [place], [place] [phone] Accepts children up to age 69 who are enrolled in [place] or [place] Health Choice; pregnant women with a Medicaid card; and children who have applied for Medicaid or [place] Health Choice, but were declined, whose parents can pay a reduced fee at time of service.  [facility] Department of [facility]  [address], [place] [phone] Accepts children up to age 55 who are enrolled in [place] or [place] Health Choice; pregnant women with a Medicaid card; and children who have applied for Medicaid or [place] Health Choice, but were declined, whose parents can pay a reduced fee at time of service.  [facility]  [address] [place], [place] [phone] Patients are seen by appointment only. Walk-ins are not accepted. [facility] will see patients 71 years of age and older. Monday - Tuesday (8am-5pm) Most Wednesdays (8:30-5pm) $30 per visit, cash only  [facility] Access [facility]  [address], [facility] [phone] Patients are seen by appointment only. Walk-ins are not accepted. [facility] will see patients 71 years of age and older. One Wednesday Evening (Monthly: Volunteer  Based).  $30 per visit, cash only  [employer]  [phone] for adults; Children under age 25, call Graduate Pediatric Dentistry at [phone]. Children aged 78-14, please call [phone] to request a pediatric application.  Dental services are provided in all areas of dental care including fillings, crowns and bridges, complete and partial dentures, implants, gum treatment, root canals, and extractions. Preventive care is also provided. Treatment is provided to both adults and children. Patients are selected via a lottery and there is often a waiting list.   [facility] [address], [place]in[place] Otto  [phone] [email or web address]   [facility] [address], [place], [place] [phone], Ext. [phone] Second and Fourth Thursday of each month, opens at 6:30 AM; Clinic ends at 9 AM.  Patients are seen on a first-come first-served basis, and a limited number are seen during each clinic.   [facility]  [address], [place], [place] [phone]   Eligibility Requirements You must have lived in [place], [place], or [place] counties for at least the last three months.   You cannot be eligible for state or federal sponsored [place] Cityhealthcare insurance, including CIGNAVeterans Administration, [place]icaid, or [employer]Medicare.   You generally cannot be eligible for healthcare insurance through your employer.    How to apply: Eligibility screenings are held every Tuesday and Wednesday afternoon from 1:00 pm until 4:00 pm. You do not need an appointment for the interview!  [facility] [address], [place], [place] [phone]   [facility]  [phone]   [facility]  [phone]   [facility]  [phone]    Behavioral Health Resources in the Community: Intensive Outpatient Programs Organization         Address  Phone  Notes  [facility]  [facility] [address], [place], [place] [phone]   [facility] Outpatient [address], [place], [place] [phone]   [facility] [address], [place], [place]  [phone]   [facility] [address],  [place], [place] [phone] or [phone]   Substance Abuse Resources Organization         Address  Phone  Notes  [facility]  [phone]   [facility]  [phone]   [facility]  [phone]   [facility]  [phone]   Residential & Outpatient Substance Abuse Program  [phone]   Psychological Services Organization         Address  Phone  Notes  [facility]  [phone]- [phone]   [facility]  [phone]- [phone]   [facility] [address], [place] [phone] or [phone]    Mobile Crisis Teams Organization         Address  Phone  Notes  [facility], Mobile Crisis Care Unit  [phone]   [facility]  [address] [place], [place] [phone]   Doristine LocksSharon DeEsch [address] [place] [phone]    Self-Help/Support Groups Organization         Address  Phone             Notes  [facility] [place] - variety of support groups  [phone] Call for more information  Narcotics Anonymous (NA), [facility] [address], [place] Joes  2 meetings at this location   Statisticianesidential Treatment Programs Organization         Address  Phone  Notes  [facility] [address],    [place]  [phone]   [facility]  [address], [place] [phone], [place], [place] [phone]   [facility] [address], [place] [phone] Admissions: 8am-3pm M-F  [facility] [address]    [place], [place] [phone]   [facility] [address], [place], [place] [phone]    [facility] [address]  [place], [place] [phone]   [facility] -  Intensive Outpatient [address], [place], [place] [phone]   [facility] ([facility] [address]  [place], [place] [phone] or [phone]   [facility] ([facility]) [address] [place], [place] [phone] Accepts Medicaid  [facility] [address]  [place] [phone] Substance Abuse/Addiction Treatment   [facility] Resources Organization         Address  Phone  Notes  [facility]  [phone]   Angie FavaJulie Brannon, PhD [address], [address] [place], [place]   [phone](336) [phone] or [phone]   [facility]   [address] [place], [place] [phone]   [facility] [address], [place], [place] [phone] Insurance/Medicaid/sponsorship through [facility]  Faith and Families [address]                                    [place], [place] [phone] Therapy/tele-psych/case  [facility] [address].   [place], [place] [phone]    Dr. Lolly MustacheArfeen  [phone]   [facility] [place]  [employer] [facility] 1) [address], [place] 2) [address], [place] 3)  [address], [place] [phone]  [phone](336) [phone]   [facility] Child Abuse Hotline [phone] or [phone]) [phone] (After Hours)

## 2014-04-05 NOTE — ED Notes (Signed)
Patient states that if he lays still that his pain in his stomach eases off.

## 2014-04-05 NOTE — ED Provider Notes (Signed)
10050- patient has clear speech and stands up independently without difficulty; states has had chronic abdominal pain 24 hours a day for the last few months which waxes and wanes and once every several weeks he has a day where his pain is worse like he was today; his pain is now resolved but is improving back towards his baseline level and repeat examination reveals mild diffuse tenderness without rebound; CT scan shows no obvious obstruction or acute inflammatory changes. Patient informed of clinical course, understand medical decision-making process, and agree with plan.  Brent HornJohn M Zafirah Vanzee, MD 04/09/14 2227

## 2014-04-07 LAB — URINE CULTURE
CULTURE: NO GROWTH
Colony Count: NO GROWTH

## 2014-05-03 IMAGING — CT CT ABD-PELV W/ CM
2 of 5 series · 16 of 46 positions shown, 18 images · IV contrast (Omnipaque 300)
Comparison: CT ABD - PELV W/ CM dated 02/19/2014

CLINICAL DATA: Right lower quadrant pain. Recent small bowel
obstruction.

EXAM:
CT ABDOMEN AND PELVIS WITH CONTRAST
TECHNIQUE: Multidetector CT imaging of the abdomen and pelvis was performed
using the standard protocol following bolus administration of
intravenous contrast.
CONTRAST:  50mL OMNIPAQUE IOHEXOL 300 MG/ML SOLN, 100mL OMNIPAQUE
IOHEXOL 300 MG/ML SOLN

[Series 2: abd_pel_with 5.0 b40f · axial · 0.61mm/px · z∈[+590,+940]mm · 13 of 80 slices shown, 15 images]
[im 5/80  soft-tissue]
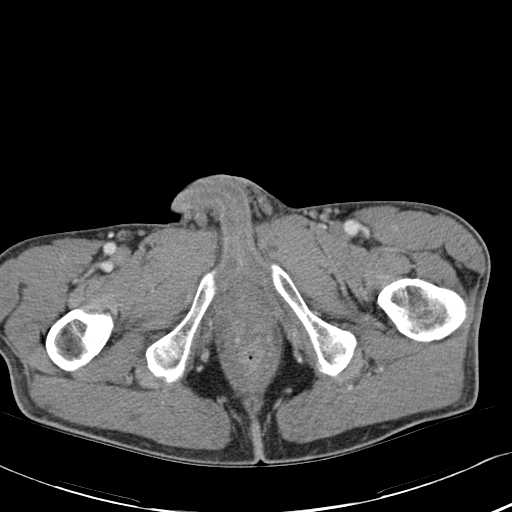
[im 5/80  bone]
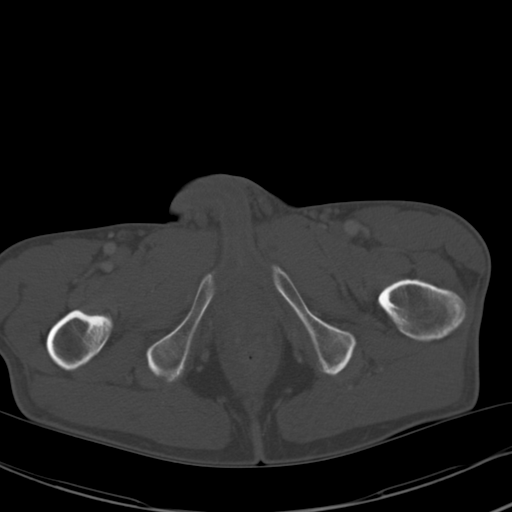
[im 13/80  soft-tissue]
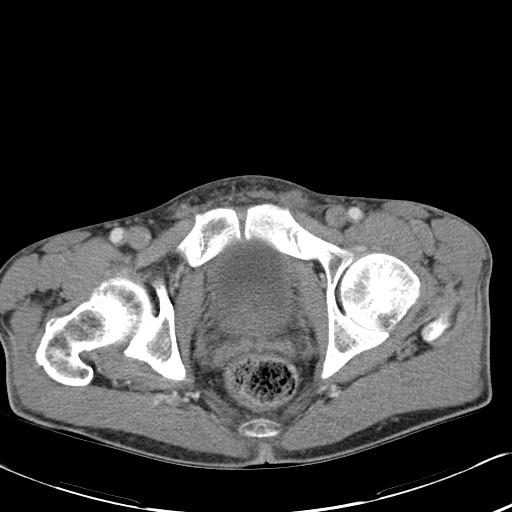
[im 17/80  soft-tissue]
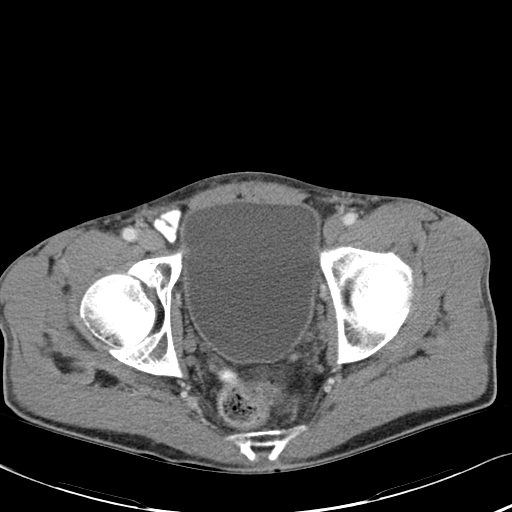
[im 21/80  soft-tissue]
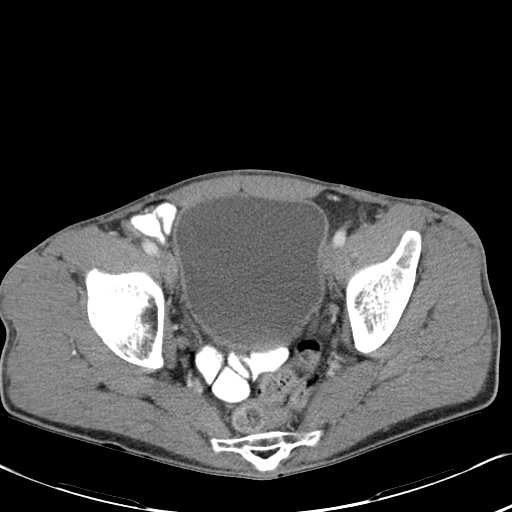
[im 30/80  soft-tissue]
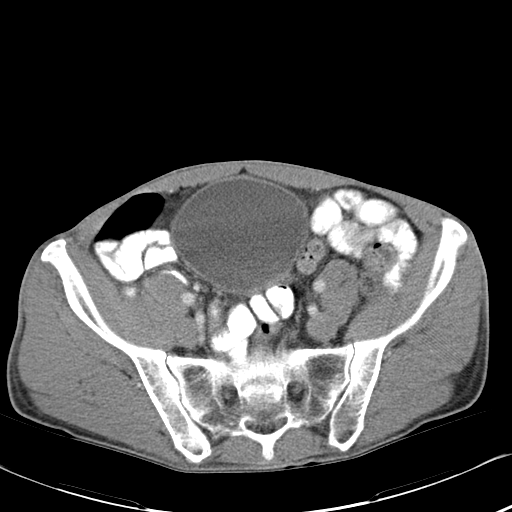
[im 34/80  soft-tissue]
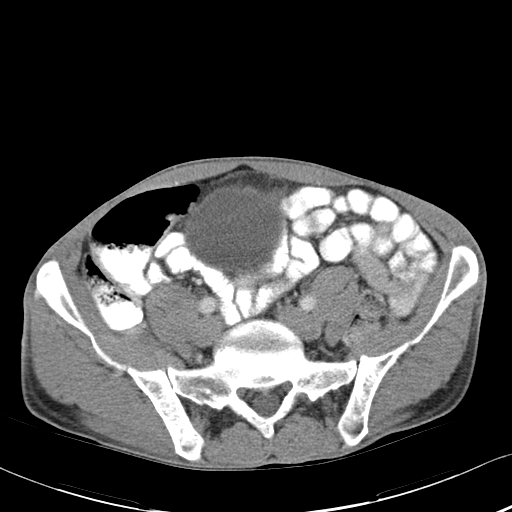
[im 42/80  soft-tissue]
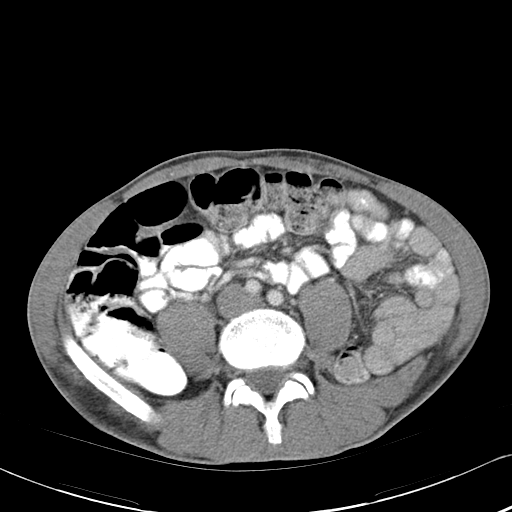
[im 46/80  soft-tissue]
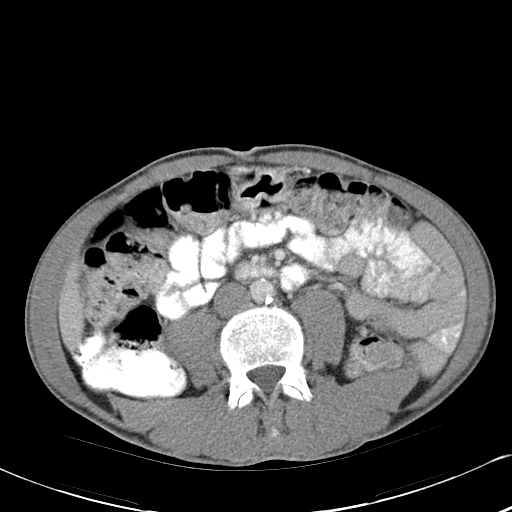
[im 50/80  soft-tissue]
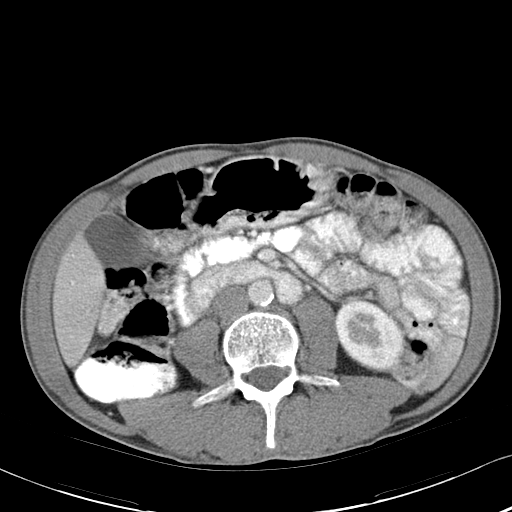
[im 50/80  bone]
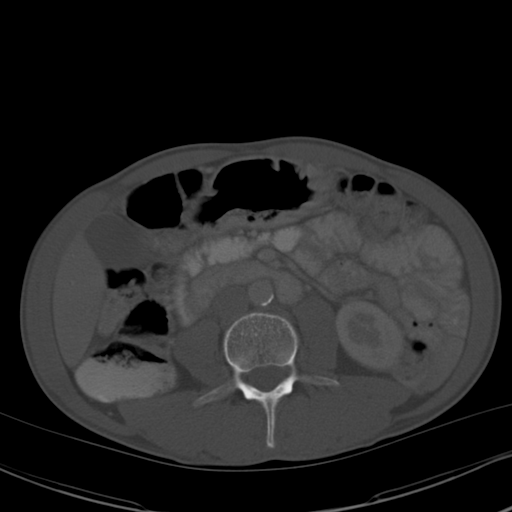
[im 59/80  soft-tissue]
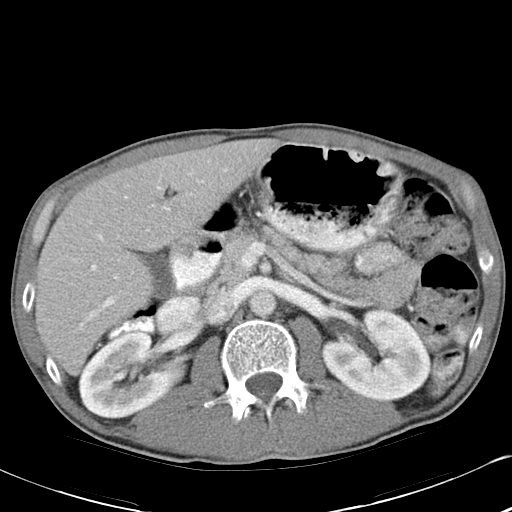
[im 63/80  soft-tissue]
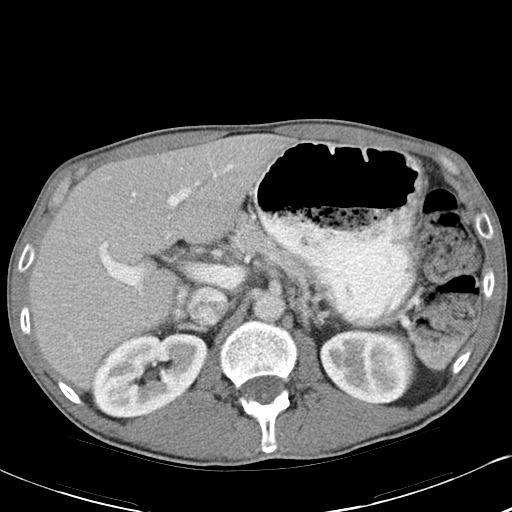
[im 67/80  soft-tissue]
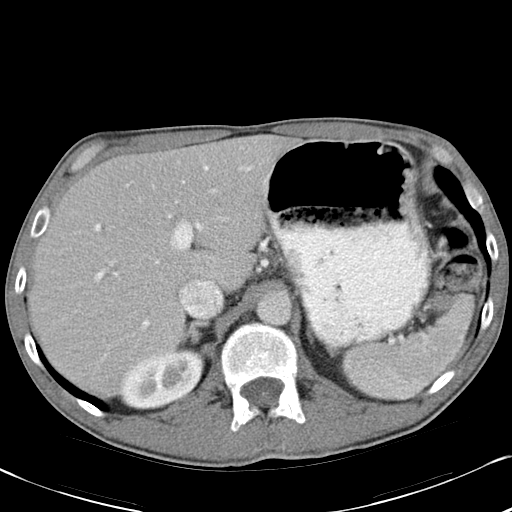
[im 75/80  soft-tissue]
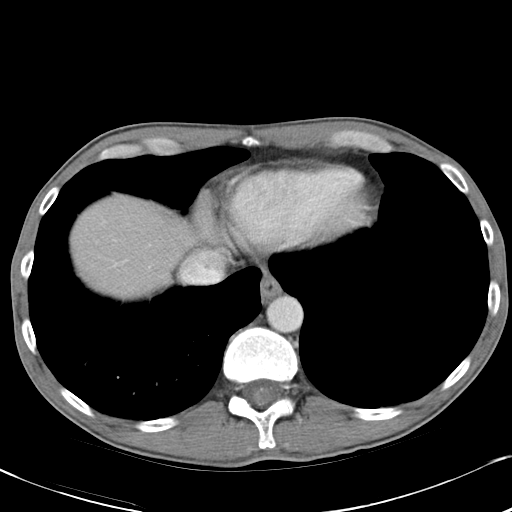

[Series 4: abd_pel_with 3.0 spo · coronal · 0.62mm/px · 3 of 69 slices shown]
[im 23/69  soft-tissue]
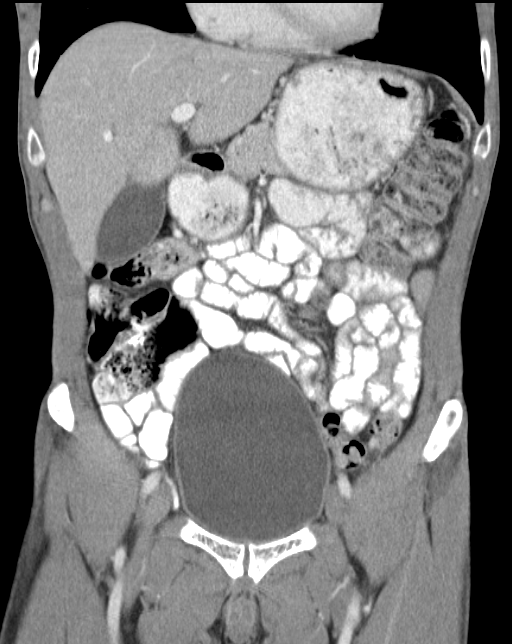
[im 31/69  soft-tissue]
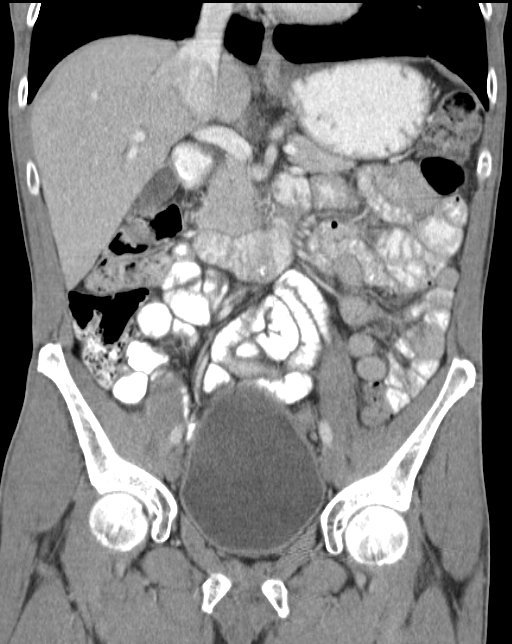
[im 38/69  soft-tissue]
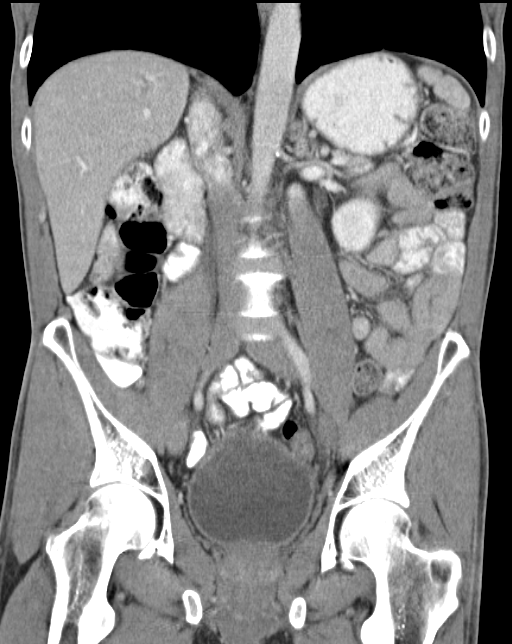

[16 of 46 positions shown; findings below may reference images not displayed]

FINDINGS: Mild scarring in the lung bases.

15 mm focal lesion in the posterior segment right lobe of the liver
demonstrates low attenuation on the portal venous phase and
suggestion of mild peripheral contrast opacification. This is stable
since previous study and probably represents a hemangioma. No other
focal liver lesions. The gallbladder, spleen, pancreas, adrenal
glands, kidneys, inferior vena cava, and retroperitoneal lymph nodes
are unremarkable. Scattered calcifications in the abdominal aorta
without aneurysm. The stomach and small bowel are not abnormally
distended. Stool-filled colon without distention. No free air or
free fluid in the abdomen.

Pelvis: Prostate gland is not enlarged. Bladder wall is not
thickened. No free or loculated pelvic fluid collections. No pelvic
mass or lymphadenopathy. Appendix is normal. No evidence of
diverticulitis. No destructive bone lesions.
IMPRESSION: No acute process demonstrated in the abdomen or pelvis. Interval
resolution of previous small bowel obstruction. No significant
residual ascites. Probable small hemangioma in the liver.

## 2023-01-23 ENCOUNTER — Ambulatory Visit (HOSPITAL_COMMUNITY)
Admission: RE | Admit: 2023-01-23 | Discharge: 2023-01-23 | Disposition: A | Discharge status: Home / Self Care | Payer: Medicaid Other | Source: Ambulatory Visit | Attending: Gerontology | Admitting: Gerontology

## 2023-01-23 ENCOUNTER — Other Ambulatory Visit (HOSPITAL_COMMUNITY): Payer: Self-pay | Admitting: Gerontology

## 2023-01-23 DIAGNOSIS — R059 Cough, unspecified: Secondary | ICD-10-CM

## 2024-02-09 ENCOUNTER — Other Ambulatory Visit (HOSPITAL_COMMUNITY): Payer: Self-pay | Admitting: Gerontology

## 2024-02-09 ENCOUNTER — Ambulatory Visit (HOSPITAL_COMMUNITY)
Admission: RE | Admit: 2024-02-09 | Discharge: 2024-02-09 | Disposition: A | Discharge status: Home / Self Care | Payer: MEDICAID | Source: Ambulatory Visit | Attending: Gerontology | Admitting: Gerontology

## 2024-02-09 DIAGNOSIS — M79642 Pain in left hand: Secondary | ICD-10-CM | POA: Insufficient documentation

## 2024-08-22 DIAGNOSIS — F17210 Nicotine dependence, cigarettes, uncomplicated: Secondary | ICD-10-CM | POA: Diagnosis not present

## 2024-08-22 DIAGNOSIS — F4310 Post-traumatic stress disorder, unspecified: Secondary | ICD-10-CM | POA: Diagnosis not present

## 2024-08-22 DIAGNOSIS — J449 Chronic obstructive pulmonary disease, unspecified: Secondary | ICD-10-CM | POA: Diagnosis not present

## 2024-08-22 DIAGNOSIS — Z23 Encounter for immunization: Secondary | ICD-10-CM | POA: Diagnosis not present

## 2024-08-22 DIAGNOSIS — M130 Polyarthritis, unspecified: Secondary | ICD-10-CM | POA: Diagnosis not present

## 2024-08-22 DIAGNOSIS — I10 Essential (primary) hypertension: Secondary | ICD-10-CM | POA: Diagnosis not present

## 2024-09-10 DIAGNOSIS — Z1212 Encounter for screening for malignant neoplasm of rectum: Secondary | ICD-10-CM | POA: Diagnosis not present

## 2024-09-10 DIAGNOSIS — Z1211 Encounter for screening for malignant neoplasm of colon: Secondary | ICD-10-CM | POA: Diagnosis not present

## 2024-09-30 DIAGNOSIS — F1020 Alcohol dependence, uncomplicated: Secondary | ICD-10-CM | POA: Diagnosis not present
